# Patient Record
Sex: Male | Born: 1990 | Race: Black or African American | Hispanic: No | Marital: Single | Smoking: Never smoker
Health system: Southern US, Community
[De-identification: ages and names within clinical notes are randomized; demographics above are authoritative.]

## PROBLEM LIST (undated history)

## (undated) DIAGNOSIS — K635 Polyp of colon: Secondary | ICD-10-CM

## (undated) DIAGNOSIS — Z789 Other specified health status: Secondary | ICD-10-CM

## (undated) DIAGNOSIS — T7840XA Allergy, unspecified, initial encounter: Secondary | ICD-10-CM

## (undated) DIAGNOSIS — J45909 Unspecified asthma, uncomplicated: Secondary | ICD-10-CM

## (undated) DIAGNOSIS — I10 Essential (primary) hypertension: Secondary | ICD-10-CM

## (undated) HISTORY — DX: Allergy, unspecified, initial encounter: T78.40XA

## (undated) HISTORY — DX: Polyp of colon: K63.5

## (undated) HISTORY — PX: TONSILLECTOMY: SUR1361

---

## 2012-10-27 DIAGNOSIS — K635 Polyp of colon: Secondary | ICD-10-CM

## 2012-10-27 HISTORY — DX: Polyp of colon: K63.5

## 2012-10-27 HISTORY — PX: COLONOSCOPY: SHX174

## 2012-10-27 HISTORY — PX: UPPER GI ENDOSCOPY: SHX6162

## 2017-01-28 ENCOUNTER — Encounter: Payer: Self-pay | Admitting: Medical

## 2017-01-28 ENCOUNTER — Ambulatory Visit: Payer: Self-pay | Admitting: Medical

## 2017-01-28 VITALS — BP 130/82 | HR 84 | Temp 97.9°F | Resp 16 | Ht 75.0 in | Wt 347.0 lb

## 2017-01-28 DIAGNOSIS — R110 Nausea: Secondary | ICD-10-CM

## 2017-01-28 DIAGNOSIS — K219 Gastro-esophageal reflux disease without esophagitis: Secondary | ICD-10-CM

## 2017-01-28 DIAGNOSIS — R5383 Other fatigue: Secondary | ICD-10-CM

## 2017-01-28 DIAGNOSIS — R1011 Right upper quadrant pain: Secondary | ICD-10-CM

## 2017-01-28 DIAGNOSIS — R58 Hemorrhage, not elsewhere classified: Secondary | ICD-10-CM

## 2017-01-28 DIAGNOSIS — R1013 Epigastric pain: Secondary | ICD-10-CM

## 2017-01-28 MED ORDER — OMEPRAZOLE 20 MG PO CPDR: 20.0000 mg | DELAYED_RELEASE_CAPSULE | Freq: Every day | ORAL | 0 refills | Status: DC

## 2017-01-28 NOTE — Progress Notes (Signed)
Subjective:    Patient ID: Gerald Hanson, male    DOB: 01-30-1991, 26 y.o.   MRN: [id number]  HPI  26 yo male started Sunday morning , with abdominal cramping, fatigue, clammy feeling and nausea vomited one time. Seemed to improve Sunday evening and Monday.  Monday  epigastric pain starting at  3pm, described as epigastric area,  sharp , constant initially and then on and off. Able to take a nap.  Had a burger and french fries (only ate this on Monday) at lunch time.  Woke up with on and off pain, stopped around 9 pm. Stomach felt heavy and tight. Tuesday ate lunch salad with girlled chicken and tomato basal soup , then pain resumed. Sharp , intense, used heating pad and took a nap.  Queasy feeling ate chicken quesadilla and salad, no pain afterwards , just nausea. Today hungry and pressure , no pain.  Did not eat breakfast. Colonoscopy 2014 and had a polyp removed. Endoscope done at that time but patient said he resisted and was difficult to remain sedated and so the test was not that good in diagnostic quality.  He states he has has these symptoms for a while but the last episode started on Sunday. Has also noticed blood in the toilet a couple times cannot recall exact time frame.  Also notes with wiping blood noted everyday x  2 weeks on tissue.Denies hemorrhoids.Denies any blood in stool. Has BM 4 times per day. Ranging from hard to complete liquid since colleage. Family history of  colon cancer in father, h. Pylori, diverticulitis , and removal of  18 inches of colon. in maternal grandmother, mother with  GERD and Aunt with Crohns disease. Patient using peptobismol  about 2 x per week for symptoms.  His symptoms seem to occur with eating so he has been eating less. Only a  3-5 lb weight loss noted since the symptoms began on Sunday.  Did loose about 30 lbs last spring while trying to loose weight. He is in no pain now, however he has not eaten today either.     Review of Systems  Constitutional:  Negative for chills, diaphoresis and fever.  HENT: Positive for sore throat.   Eyes: Negative.   Respiratory: Negative for cough and wheezing.   Cardiovascular: Negative for chest pain, palpitations and leg swelling.  Gastrointestinal: Positive for abdominal pain, constipation, diarrhea, nausea and vomiting.       Objective:   Physical Exam  Constitutional: He is oriented to person, place, and time. He appears well-developed and well-nourished.  HENT:  Head: Normocephalic and atraumatic.  Mouth/Throat: Uvula is midline, oropharynx is clear and moist and mucous membranes are normal.  Eyes: Conjunctivae and EOM are normal. Pupils are equal, round, and reactive to light.  Cardiovascular: Normal rate and regular rhythm.  Exam reveals no gallop and no friction rub.   No murmur heard. Pulmonary/Chest: Effort normal and breath sounds normal.  Abdominal: Soft. Bowel sounds are normal. He exhibits no distension and no mass. There is tenderness. There is no rebound and no guarding.  Musculoskeletal: Normal range of motion.  Neurological: He is alert and oriented to person, place, and time.  Skin: Skin is dry.  Psychiatric: He has a normal mood and affect.  Nursing note and vitals reviewed. patient noted to be obese, he is  tender with palpation of epigastric area and medial side of RUQ,  Mild erythema of Uvula no swelilng, posterior pharynx within normal limits   Patient  declines rectal exam due to time constraints. 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    Assessment & Plan:   Abdominal pain epigastric and RUQ pain,and nausea will order CBC, Met C , lipase, Vit D level ,TSH  Bland diet,reviewed with patient, no acidic foods ie tomatoes or orange juice, to avoid alcohol and caffeine. Will schedule for RUQ U/S. Started patient on omeprazole 20 mg once per day  #30 no refill. Return to the clinic as needed; if pain worsens or nausea and  vomiting or fever occur.

## 2017-01-30 ENCOUNTER — Ambulatory Visit: Payer: Self-pay

## 2017-02-02 ENCOUNTER — Telehealth: Payer: Self-pay | Admitting: Medical

## 2017-02-02 ENCOUNTER — Ambulatory Visit
Admission: RE | Admit: 2017-02-02 | Discharge: 2017-02-02 | Disposition: A | Discharge status: Home / Self Care | Payer: BLUE CROSS/BLUE SHIELD | Source: Ambulatory Visit | Attending: Medical | Admitting: Medical

## 2017-02-02 DIAGNOSIS — R1011 Right upper quadrant pain: Secondary | ICD-10-CM | POA: Diagnosis present

## 2017-02-02 DIAGNOSIS — K8020 Calculus of gallbladder without cholecystitis without obstruction: Secondary | ICD-10-CM | POA: Insufficient documentation

## 2017-02-02 DIAGNOSIS — R110 Nausea: Secondary | ICD-10-CM | POA: Diagnosis present

## 2017-02-02 NOTE — Telephone Encounter (Signed)
Called patient with U/S results. He has pending lab work 02/03/2017 and appointment with GI at 9:30 am. He is feeling much better since following a bland diet.

## 2017-02-03 ENCOUNTER — Other Ambulatory Visit: Payer: Self-pay

## 2017-02-04 ENCOUNTER — Other Ambulatory Visit: Payer: Self-pay

## 2017-02-04 DIAGNOSIS — Z0000 Encounter for general adult medical examination without abnormal findings: Secondary | ICD-10-CM

## 2017-02-05 ENCOUNTER — Encounter: Payer: Self-pay | Admitting: *Deleted

## 2017-02-05 LAB — CMP12+LP+TP+TSH+6AC+CBC/D/PLT
ALK PHOS: 109 IU/L (ref 39–117)
ALT: 52 IU/L — AB (ref 0–44)
AST: 12 IU/L (ref 0–40)
Albumin/Globulin Ratio: 1.6 (ref 1.2–2.2)
Albumin: 4.2 g/dL (ref 3.5–5.5)
BASOS: 1 %
BUN / CREAT RATIO: 13 (ref 9–20)
BUN: 11 mg/dL (ref 6–20)
Basophils Absolute: 0 10*3/uL (ref 0.0–0.2)
Bilirubin Total: 0.5 mg/dL (ref 0.0–1.2)
CALCIUM: 9.1 mg/dL (ref 8.7–10.2)
CHOL/HDL RATIO: 5.4 ratio — AB (ref 0.0–5.0)
CREATININE: 0.82 mg/dL (ref 0.76–1.27)
Chloride: 100 mmol/L (ref 96–106)
Cholesterol, Total: 174 mg/dL (ref 100–199)
EOS (ABSOLUTE): 0.1 10*3/uL (ref 0.0–0.4)
Eos: 1 %
Estimated CHD Risk: 1.1 times avg. — ABNORMAL HIGH (ref 0.0–1.0)
Free Thyroxine Index: 1.7 (ref 1.2–4.9)
GFR, EST AFRICAN AMERICAN: 143 mL/min/{1.73_m2} (ref >59)
GFR, EST NON AFRICAN AMERICAN: 124 mL/min/{1.73_m2} (ref >59)
GGT: 364 IU/L — AB (ref 0–65)
GLUCOSE: 99 mg/dL (ref 65–99)
Globulin, Total: 2.7 g/dL (ref 1.5–4.5)
HDL: 32 mg/dL — AB (ref >39)
HEMATOCRIT: 41.3 % (ref 37.5–51.0)
Hemoglobin: 12.9 g/dL — ABNORMAL LOW (ref 13.0–17.7)
IMMATURE GRANS (ABS): 0 10*3/uL (ref 0.0–0.1)
Immature Granulocytes: 0 %
Iron: 108 ug/dL (ref 38–169)
LDH: 145 IU/L (ref 121–224)
LDL CALC: 118 mg/dL — AB (ref 0–99)
LYMPHS: 35 %
Lymphocytes Absolute: 2.3 10*3/uL (ref 0.7–3.1)
MCH: 22.7 pg — ABNORMAL LOW (ref 26.6–33.0)
MCHC: 31.2 g/dL — ABNORMAL LOW (ref 31.5–35.7)
MCV: 73 fL — AB (ref 79–97)
MONOCYTES: 8 %
Monocytes Absolute: 0.5 10*3/uL (ref 0.1–0.9)
NEUTROS ABS: 3.7 10*3/uL (ref 1.4–7.0)
Neutrophils: 55 %
PHOSPHORUS: 4.3 mg/dL (ref 2.5–4.5)
POTASSIUM: 4.4 mmol/L (ref 3.5–5.2)
Platelets: 340 10*3/uL (ref 150–379)
RBC: 5.68 x10E6/uL (ref 4.14–5.80)
RDW: 15.7 % — AB (ref 12.3–15.4)
Sodium: 139 mmol/L (ref 134–144)
T3 Uptake Ratio: 26 % (ref 24–39)
T4 TOTAL: 6.4 ug/dL (ref 4.5–12.0)
TSH: 1.76 u[IU]/mL (ref 0.450–4.500)
Total Protein: 6.9 g/dL (ref 6.0–8.5)
Triglycerides: 119 mg/dL (ref 0–149)
URIC ACID: 6.6 mg/dL (ref 3.7–8.6)
VLDL Cholesterol Cal: 24 mg/dL (ref 5–40)
WBC: 6.6 10*3/uL (ref 3.4–10.8)

## 2017-02-05 LAB — VITAMIN D 25 HYDROXY (VIT D DEFICIENCY, FRACTURES): Vit D, 25-Hydroxy: 12 ng/mL — ABNORMAL LOW (ref 30.0–100.0)

## 2017-02-05 LAB — LIPASE: LIPASE: 15 U/L (ref 13–78)

## 2017-02-09 ENCOUNTER — Encounter: Payer: Self-pay | Admitting: Medical

## 2017-02-09 ENCOUNTER — Telehealth: Payer: Self-pay | Admitting: Medical

## 2017-02-09 NOTE — Telephone Encounter (Signed)
Called patient and reviewed labs, lipase with in normal limits.  Vitamin D is low.  He says he is pending surgical consult for his gallbladder.  GI put patient on omeprazole for a longer time period ( note reviewed). Medication has  helped patient with GERD symptoms. Reviewed with patient low vitamin D level. Will email patient the amount of vitamin D3 to take.

## 2017-02-12 ENCOUNTER — Ambulatory Visit (INDEPENDENT_AMBULATORY_CARE_PROVIDER_SITE_OTHER): Payer: BLUE CROSS/BLUE SHIELD | Admitting: General Surgery

## 2017-02-12 ENCOUNTER — Encounter: Payer: Self-pay | Admitting: General Surgery

## 2017-02-12 VITALS — BP 128/88 | HR 84 | Resp 12 | Ht 75.0 in | Wt 344.0 lb

## 2017-02-12 DIAGNOSIS — K8020 Calculus of gallbladder without cholecystitis without obstruction: Secondary | ICD-10-CM | POA: Diagnosis not present

## 2017-02-12 NOTE — Progress Notes (Addendum)
Patient ID: Gerald Hanson, male   DOB: April 05, 1991, 26 y.o.   MRN: [id number]  Chief Complaint  Patient presents with  . Other    gall stones    HPI Gerald Hanson is a 26 y.o. male.  Here today for evaluation of his gall bladder. He does admit to indigestion(eggs and onions) and abdominal pain. The pain is upper right abdomen and central abdomen. Last pain episode was 1.5 weeks ago. Bowels move at least 2-3 times a day.  He was shopping after Easter and he had intense abdominal pain and chills, he thought it was food poisoning. After several hours it calmed down but the next day it came back after eating a salad with nausea. He then had and abdominal ultasound 02-02-17.  He has recently stopped his omeprazole so he could be tested for H pylori by GI, Tawni Pummel PA. He works at [employer] with [employer].  HPI  Past Medical History:  Diagnosis Date  . Allergy   . Colon polyp 2014    Past Surgical History:  Procedure Laterality Date  . COLONOSCOPY  2014   Dr Cecelia Byars  . TONSILLECTOMY    . UPPER GI ENDOSCOPY  2014    Family History  Problem Relation Age of Onset  . Colon cancer Father 1  . Diverticulitis Maternal Grandmother   . Crohn's disease Maternal Aunt     Social History Social History  Substance Use Topics  . Smoking status: Never Smoker  . Smokeless tobacco: Never Used  . Alcohol use Yes     Comment: socially    Allergies  Allergen Reactions  . Blackberry Flavor Other (See Comments)    Lip numbness    No current outpatient prescriptions on file.   No current facility-administered medications for this visit.     Review of Systems Review of Systems  Constitutional: Negative.   Respiratory: Negative.   Cardiovascular: Negative.   Gastrointestinal: Positive for abdominal pain.    Blood pressure 128/88, pulse 84, resp. rate 12, height  (1.905 m), weight (!) 344 lb (156 kg).  Physical Exam Physical Exam  Constitutional: He is oriented to  person, place, and time. He appears well-developed and well-nourished.  HENT:  Mouth/Throat: Oropharynx is clear and moist.  Eyes: Conjunctivae are normal. No scleral icterus.  Neck: Neck supple.  Cardiovascular: Normal rate, regular rhythm and normal heart sounds.   Pulmonary/Chest: Effort normal and breath sounds normal.  Abdominal: Soft. Normal appearance and bowel sounds are normal. There is no hepatomegaly. There is no tenderness.  Lymphadenopathy:    He has no cervical adenopathy.  Neurological: He is alert and oriented to person, place, and time.  Skin: Skin is warm and dry.  Psychiatric: His behavior is normal.    Data Reviewed Abdominal ultrasound 02/02/2017 was reviewed. Multiple gallstones. No evidence of acute cholecystitis. Laboratory studies of 02/04/2017 were reviewed:  Lipase normal at 15. Elevated GGT at 364, elevated SGPT at 52. Normal LDH/SGOT, total bilirubin and alkaline phosphatase.  Normal electrolytes and renal function. Hemoglobin 12.9 with an MCV of 73, platelet count 140,000, white blood cell count of 6600 with normal differential.  GI consultation note of 02/12/2017 from Tawni Pummel, [place] reviewed.     Assessment    Symptomatic cholelithiasis.  Microcytosis with minimal anemia.  Elevated serum GGT.    Plan    We'll arrange for the patient have a hemoglobin electrophoresis to look for hemoglobin S, as well as checking iron and ferritin levels.Marland Kitcheno Will have  the patient complete a set of stool hemocult slides as well.   Dietary modification to avoid symptoms prior to cholecystectomy were discussed.    Laparoscopic Cholecystectomy with Intraoperative Cholangiogram. The procedure, including it's potential risks and complications (including but not limited to infection, bleeding, injury to intra-abdominal organs or bile ducts, bile leak, poor cosmetic result, sepsis and death) were discussed with the patient in detail. Non-operative options, including  their inherent risks (acute calculous cholecystitis with possible choledocholithiasis or gallstone pancreatitis, with the risk of ascending cholangitis, sepsis, and death) were discussed as well. The patient expressed and understanding of what we discussed and wishes to proceed with laparoscopic cholecystectomy. The patient further understands that if it is technically not possible, or it is unsafe to proceed laparoscopically, that I will convert to an open cholecystectomy.   HPI, Physical Exam, Assessment and Plan have been scribed under the direction and in the presence of Earline Mayotte, MD.  Dorathy Daft, RN  I have completed the exam and reviewed the above documentation for accuracy and completeness.  I agree with the above.  Museum/gallery conservator has been used and any errors in dictation or transcription are unintentional.  Donnalee Curry, M.D., F.A.C.S.   Earline Mayotte 02/13/2017, 6:59 AM  Patient's surgery has been scheduled for 04-24-17 at [facility]. This is per his request to wait until the school year is out. Patient will also be taking a summer trip 04-10-17 through 04-19-17. History and physical will be updated the morning of procedure.   Nicholes Mango, CMA

## 2017-02-12 NOTE — Patient Instructions (Addendum)
The patient is aware to call back for any questions or concerns.  Laparoscopic Cholecystectomy Laparoscopic cholecystectomy is surgery to remove the gallbladder. The gallbladder is a pear-shaped organ that lies beneath the liver on the right side of the body. The gallbladder stores bile, which is a fluid that helps the body to digest fats. Cholecystectomy is often done for inflammation of the gallbladder (cholecystitis). This condition is usually caused by a buildup of gallstones (cholelithiasis) in the gallbladder. Gallstones can block the flow of bile, which can result in inflammation and pain. In severe cases, emergency surgery may be required. This procedure is done though small incisions in your abdomen (laparoscopic surgery). A thin scope with a camera (laparoscope) is inserted through one incision. Thin surgical instruments are inserted through the other incisions. In some cases, a laparoscopic procedure may be turned into a type of surgery that is done through a larger incision (open surgery). Tell a health care provider about:  Any allergies you have.  All medicines you are taking, including vitamins, herbs, eye drops, creams, and over-the-counter medicines.  Any problems you or family members have had with anesthetic medicines.  Any blood disorders you have.  Any surgeries you have had.  Any medical conditions you have.  Whether you are pregnant or may be pregnant. What are the risks? Generally, this is a safe procedure. However, problems may occur, including:  Infection.  Bleeding.  Allergic reactions to medicines.  Damage to other structures or organs.  A stone remaining in the common bile duct. The common bile duct carries bile from the gallbladder into the small intestine.  A bile leak from the cyst duct that is clipped when your gallbladder is removed. What happens before the procedure? Staying hydrated  Follow instructions from your health care provider about  hydration, which may include:  Up to 2 hours before the procedure - you may continue to drink clear liquids, such as water, clear fruit juice, black coffee, and plain tea. Eating and drinking restrictions  Follow instructions from your health care provider about eating and drinking, which may include:  8 hours before the procedure - stop eating heavy meals or foods such as meat, fried foods, or fatty foods.  6 hours before the procedure - stop eating light meals or foods, such as toast or cereal.  6 hours before the procedure - stop drinking milk or drinks that contain milk.  2 hours before the procedure - stop drinking clear liquids. Medicines   Ask your health care provider about:  Changing or stopping your regular medicines. This is especially important if you are taking diabetes medicines or blood thinners.  Taking medicines such as aspirin and ibuprofen. These medicines can thin your blood. Do not take these medicines before your procedure if your health care provider instructs you not to.  You may be given antibiotic medicine to help prevent infection. General instructions   Let your health care provider know if you develop a cold or an infection before surgery.  Plan to have someone take you home from the hospital or clinic.  Ask your health care provider how your surgical site will be marked or identified. What happens during the procedure?  To reduce your risk of infection:  Your health care team will wash or sanitize their hands.  Your skin will be washed with soap.  Hair may be removed from the surgical area.  An IV tube may be inserted into one of your veins.  You will be given   one or more of the following:  A medicine to help you relax (sedative).  A medicine to make you fall asleep (general anesthetic).  A breathing tube will be placed in your mouth.  Your surgeon will make several small cuts (incisions) in your abdomen.  The laparoscope will be  inserted through one of the small incisions. The camera on the laparoscope will send images to a TV screen (monitor) in the operating room. This lets your surgeon see inside your abdomen.  Air-like gas will be pumped into your abdomen. This will expand your abdomen to give the surgeon more room to perform the surgery.  Other tools that are needed for the procedure will be inserted through the other incisions. The gallbladder will be removed through one of the incisions.  Your common bile duct may be examined. If stones are found in the common bile duct, they may be removed.  After your gallbladder has been removed, the incisions will be closed with stitches (sutures), staples, or skin glue.  Your incisions may be covered with a bandage (dressing). The procedure may vary among health care providers and hospitals. What happens after the procedure?  Your blood pressure, heart rate, breathing rate, and blood oxygen level will be monitored until the medicines you were given have worn off.  You will be given medicines as needed to control your pain.  Do not drive for 24 hours if you were given a sedative. This information is not intended to replace advice given to you by your health care provider. Make sure you discuss any questions you have with your health care provider. Document Released: 10/13/2005 Document Revised: 05/04/2016 Document Reviewed: 03/31/2016 Elsevier Interactive Patient Education  2017 Elsevier Inc.   

## 2017-02-13 ENCOUNTER — Other Ambulatory Visit: Payer: Self-pay

## 2017-02-13 DIAGNOSIS — D649 Anemia, unspecified: Secondary | ICD-10-CM

## 2017-02-13 DIAGNOSIS — K8020 Calculus of gallbladder without cholecystitis without obstruction: Secondary | ICD-10-CM | POA: Insufficient documentation

## 2017-03-12 LAB — HEMOGLOBINOPATHY EVALUATION
HGB C: 0 %
HGB S: 0 %
HGB VARIANT: 0 %
Hemoglobin A2 Quantitation: 2.1 % (ref 1.8–3.2)
Hemoglobin F Quantitation: 0 % (ref 0.0–2.0)
Hgb A: 97.9 % (ref 96.4–98.8)

## 2017-03-12 LAB — IRON AND TIBC
IRON SATURATION: 15 % (ref 15–55)
Iron: 43 ug/dL (ref 38–169)
Total Iron Binding Capacity: 282 ug/dL (ref 250–450)
UIBC: 239 ug/dL (ref 111–343)

## 2017-03-12 LAB — FERRITIN: FERRITIN: 102 ng/mL (ref 30–400)

## 2017-03-16 ENCOUNTER — Ambulatory Visit (INDEPENDENT_AMBULATORY_CARE_PROVIDER_SITE_OTHER): Payer: BLUE CROSS/BLUE SHIELD

## 2017-03-16 DIAGNOSIS — D509 Iron deficiency anemia, unspecified: Secondary | ICD-10-CM | POA: Diagnosis not present

## 2017-03-16 LAB — POC HEMOCCULT BLD/STL (HOME/3-CARD/SCREEN)
Card #2 Fecal Occult Blod, POC: NEGATIVE
FECAL OCCULT BLD: NEGATIVE
FECAL OCCULT BLD: NEGATIVE

## 2017-04-08 ENCOUNTER — Encounter
Admission: RE | Admit: 2017-04-08 | Discharge: 2017-04-08 | Disposition: A | Discharge status: Home / Self Care | Payer: BLUE CROSS/BLUE SHIELD | Source: Ambulatory Visit | Attending: General Surgery | Admitting: General Surgery

## 2017-04-08 HISTORY — DX: Essential (primary) hypertension: I10

## 2017-04-08 HISTORY — DX: Unspecified asthma, uncomplicated: J45.909

## 2017-04-08 HISTORY — DX: Other specified health status: Z78.9

## 2017-04-08 NOTE — Patient Instructions (Signed)
  Your procedure is scheduled on: 04/24/17 Report to Same Day Surgery 2nd floor medical mall [facility]Medical Mall Entrance-take elevator on left to 2nd floor.  Check in with surgery information desk.) To find out your arrival time please call [phone] between 1PM - 3PM on  04/23/17  Remember: Instructions that are not followed completely may result in serious medical risk, up to and including death, or upon the discretion of your surgeon and anesthesiologist your surgery may need to be rescheduled.    _x___ 1. Do not eat food or drink liquids after midnight. No gum chewing or                              hard candies.     __x__ 2. No Alcohol for 24 hours before or after surgery.   __x__3. No Smoking for 24 prior to surgery.   ____  4. Bring all medications with you on the day of surgery if instructed.    __x__ 5. Notify your doctor if there is any change in your medical condition     (cold, fever, infections).     Do not wear jewelry, make-up, hairpins, clips or nail polish.  Do not wear lotions, powders, or perfumes. You may wear deodorant.  Do not shave 48 hours prior to surgery. Men may shave face and neck.  Do not bring valuables to the hospital.    [facility] is not responsible for any belongings or valuables.               Contacts, dentures or bridgework may not be worn into surgery.  Leave your suitcase in the car. After surgery it may be brought to your room.  For patients admitted to the hospital, discharge time is determined by your                       treatment team.   Patients discharged the day of surgery will not be allowed to drive home.  You will need someone to drive you home and stay with you the night of your procedure.    Please read over the following fact sheets that you were given:   [facility] Preparing for Surgery and or MRSA Information   ____ Take anti-hypertensive (unless it includes a diuretic), cardiac, seizure, asthma,     anti-reflux and psychiatric  medicines. These include:  1.   2.  3.  4.  5.  6.  ____Fleets enema or Magnesium Citrate as directed.   ____ Use CHG Soap or sage wipes as directed on instruction sheet   ____ Use inhalers on the day of surgery and bring to hospital day of surgery  ____ Stop Metformin and Janumet 2 days prior to surgery.    ____ Take 1/2 of usual insulin dose the night before surgery and none on the morning     surgery.   ____ Follow recommendations from Cardiologist, Pulmonologist or PCP regarding   stopping Aspirin, Coumadin, Pllavix ,Eliquis, Effient, or Pradaxa, and Pletal.  X____Stop Anti-inflammatories such as Advil, Aleve, Ibuprofen, Motrin, Naproxen, Naprosyn, Goodies powders or aspirin products. OK to take Tylenol and Celebrex.   ____ Stop supplements until after surgery.  But may continue Vitamin D, Vitamin B,  and multivitamin.   ____ Bring C-Pap to the hospital.

## 2017-04-09 ENCOUNTER — Other Ambulatory Visit: Payer: Self-pay | Admitting: General Surgery

## 2017-04-21 ENCOUNTER — Telehealth: Payer: Self-pay

## 2017-04-21 NOTE — Telephone Encounter (Signed)
Patient got back from a cruise on Saturday 04/18/17 and had a bit of a scratchy throat. On Sunday the soar throat was gone but today he has some head congestion. He is scheduled for surgery on 04/24/17 and is concerned about this. He reports no fever, chills, or body aches. Patient is taking Zycam and Vitamin C. Patient instructed to monitor symptoms and call back on Thursday 04/22/17 for an update and if symptoms have not resolved or have worsened we will reschedule his surgery.

## 2017-04-23 ENCOUNTER — Telehealth: Payer: Self-pay

## 2017-04-23 NOTE — Telephone Encounter (Signed)
Patient called with update. States feels like he has allergies bothering him now. He states all head congestion is gone and his scratchy throat did not return. He is just having some clear nasal drainage. He reports that he never had any fevers or chills and feels very good. Surgery instructions reviewed with the patient.

## 2017-04-24 ENCOUNTER — Ambulatory Visit: Payer: BLUE CROSS/BLUE SHIELD | Admitting: Anesthesiology

## 2017-04-24 ENCOUNTER — Ambulatory Visit
Admission: RE | Admit: 2017-04-24 | Discharge: 2017-04-24 | Disposition: A | Discharge status: Home / Self Care | Payer: BLUE CROSS/BLUE SHIELD | Source: Ambulatory Visit | Attending: General Surgery | Admitting: General Surgery

## 2017-04-24 ENCOUNTER — Encounter
Admission: RE | Disposition: A | Discharge status: Home / Self Care | Payer: Self-pay | Source: Ambulatory Visit | Attending: General Surgery

## 2017-04-24 ENCOUNTER — Ambulatory Visit: Payer: BLUE CROSS/BLUE SHIELD

## 2017-04-24 ENCOUNTER — Encounter: Payer: Self-pay | Admitting: *Deleted

## 2017-04-24 DIAGNOSIS — J45909 Unspecified asthma, uncomplicated: Secondary | ICD-10-CM | POA: Insufficient documentation

## 2017-04-24 DIAGNOSIS — Z86010 Personal history of colonic polyps: Secondary | ICD-10-CM | POA: Diagnosis not present

## 2017-04-24 DIAGNOSIS — Z79899 Other long term (current) drug therapy: Secondary | ICD-10-CM | POA: Insufficient documentation

## 2017-04-24 DIAGNOSIS — K8020 Calculus of gallbladder without cholecystitis without obstruction: Secondary | ICD-10-CM

## 2017-04-24 DIAGNOSIS — K8010 Calculus of gallbladder with chronic cholecystitis without obstruction: Secondary | ICD-10-CM | POA: Insufficient documentation

## 2017-04-24 DIAGNOSIS — Z8709 Personal history of other diseases of the respiratory system: Secondary | ICD-10-CM | POA: Insufficient documentation

## 2017-04-24 DIAGNOSIS — Z6841 Body Mass Index (BMI) 40.0 and over, adult: Secondary | ICD-10-CM | POA: Diagnosis not present

## 2017-04-24 DIAGNOSIS — I10 Essential (primary) hypertension: Secondary | ICD-10-CM | POA: Insufficient documentation

## 2017-04-24 DIAGNOSIS — Z91018 Allergy to other foods: Secondary | ICD-10-CM | POA: Diagnosis not present

## 2017-04-24 DIAGNOSIS — K8000 Calculus of gallbladder with acute cholecystitis without obstruction: Secondary | ICD-10-CM

## 2017-04-24 DIAGNOSIS — K8044 Calculus of bile duct with chronic cholecystitis without obstruction: Secondary | ICD-10-CM | POA: Diagnosis not present

## 2017-04-24 DIAGNOSIS — Z9889 Other specified postprocedural states: Secondary | ICD-10-CM | POA: Insufficient documentation

## 2017-04-24 HISTORY — PX: CHOLECYSTECTOMY: SHX55

## 2017-04-24 LAB — URINE DRUG SCREEN, QUALITATIVE (ARMC ONLY)
Amphetamines, Ur Screen: NOT DETECTED
BARBITURATES, UR SCREEN: NOT DETECTED
BENZODIAZEPINE, UR SCRN: NOT DETECTED
CANNABINOID 50 NG, UR ~~LOC~~: NOT DETECTED
Cocaine Metabolite,Ur ~~LOC~~: NOT DETECTED
MDMA (Ecstasy)Ur Screen: NOT DETECTED
Methadone Scn, Ur: NOT DETECTED
Opiate, Ur Screen: NOT DETECTED
PHENCYCLIDINE (PCP) UR S: NOT DETECTED
TRICYCLIC, UR SCREEN: NOT DETECTED

## 2017-04-24 SURGERY — LAPAROSCOPIC CHOLECYSTECTOMY WITH INTRAOPERATIVE CHOLANGIOGRAM
Anesthesia: General | Wound class: Clean Contaminated

## 2017-04-24 MED ORDER — ACETAMINOPHEN 500 MG PO TABS: 500.0000 mg | ORAL_TABLET | Freq: Four times a day (QID) | ORAL | Status: DC | PRN

## 2017-04-24 MED ORDER — ONDANSETRON HCL 4 MG/2ML IJ SOLN
INTRAMUSCULAR | Status: DC | PRN
  Administered 2017-04-24: 4 mg via INTRAVENOUS

## 2017-04-24 MED ORDER — DEXAMETHASONE SODIUM PHOSPHATE 10 MG/ML IJ SOLN
INTRAMUSCULAR | Status: DC | PRN
  Administered 2017-04-24: 5 mg via INTRAVENOUS

## 2017-04-24 MED ORDER — FENTANYL CITRATE (PF) 100 MCG/2ML IJ SOLN
INTRAMUSCULAR | Status: AC
  Filled 2017-04-24: qty 2

## 2017-04-24 MED ORDER — LACTATED RINGERS IV SOLN
INTRAVENOUS | Status: DC
  Administered 2017-04-24 (×3): via INTRAVENOUS

## 2017-04-24 MED ORDER — KETOROLAC TROMETHAMINE 30 MG/ML IJ SOLN
INTRAMUSCULAR | Status: DC | PRN
  Administered 2017-04-24: 30 mg via INTRAVENOUS

## 2017-04-24 MED ORDER — MIDAZOLAM HCL 2 MG/2ML IJ SOLN
INTRAMUSCULAR | Status: AC
  Filled 2017-04-24: qty 2

## 2017-04-24 MED ORDER — MIDAZOLAM HCL 2 MG/2ML IJ SOLN
INTRAMUSCULAR | Status: DC | PRN
  Administered 2017-04-24: 2 mg via INTRAVENOUS

## 2017-04-24 MED ORDER — FENTANYL CITRATE (PF) 100 MCG/2ML IJ SOLN
INTRAMUSCULAR | Status: DC | PRN
  Administered 2017-04-24: 50 ug via INTRAVENOUS
  Administered 2017-04-24: 100 ug via INTRAVENOUS

## 2017-04-24 MED ORDER — LIDOCAINE HCL (CARDIAC) 20 MG/ML IV SOLN
INTRAVENOUS | Status: DC | PRN
  Administered 2017-04-24: 100 mg via INTRAVENOUS

## 2017-04-24 MED ORDER — ACETAMINOPHEN 500 MG PO TABS
ORAL_TABLET | ORAL | Status: AC
  Administered 2017-04-24: 500 mg
  Filled 2017-04-24: qty 2

## 2017-04-24 MED ORDER — SODIUM CHLORIDE 0.9 % IJ SOLN
INTRAMUSCULAR | Status: AC
  Filled 2017-04-24: qty 50

## 2017-04-24 MED ORDER — HYDROMORPHONE HCL 1 MG/ML IJ SOLN
INTRAMUSCULAR | Status: DC | PRN
  Administered 2017-04-24: 0.5 mg via INTRAVENOUS

## 2017-04-24 MED ORDER — SUCCINYLCHOLINE CHLORIDE 20 MG/ML IJ SOLN
INTRAMUSCULAR | Status: DC | PRN
  Administered 2017-04-24: 120 mg via INTRAVENOUS

## 2017-04-24 MED ORDER — HYDROCODONE-ACETAMINOPHEN 5-325 MG PO TABS: 1.0000 | ORAL_TABLET | ORAL | 0 refills | Status: DC | PRN

## 2017-04-24 MED ORDER — HYDROCODONE-ACETAMINOPHEN 5-325 MG PO TABS
ORAL_TABLET | ORAL | Status: AC
  Filled 2017-04-24: qty 1

## 2017-04-24 MED ORDER — HYDROMORPHONE HCL 1 MG/ML IJ SOLN
INTRAMUSCULAR | Status: AC
  Filled 2017-04-24: qty 1

## 2017-04-24 MED ORDER — SEVOFLURANE IN SOLN
RESPIRATORY_TRACT | Status: AC
  Filled 2017-04-24: qty 250

## 2017-04-24 MED ORDER — FENTANYL CITRATE (PF) 250 MCG/5ML IJ SOLN
INTRAMUSCULAR | Status: AC
  Filled 2017-04-24: qty 5

## 2017-04-24 MED ORDER — IOTHALAMATE MEGLUMINE 60 % INJ SOLN
INTRAMUSCULAR | Status: DC | PRN
  Administered 2017-04-24: 17 mL

## 2017-04-24 MED ORDER — PROPOFOL 10 MG/ML IV BOLUS
INTRAVENOUS | Status: AC
  Filled 2017-04-24: qty 20

## 2017-04-24 MED ORDER — ROCURONIUM BROMIDE 100 MG/10ML IV SOLN
INTRAVENOUS | Status: DC | PRN
  Administered 2017-04-24: 30 mg via INTRAVENOUS
  Administered 2017-04-24: 20 mg via INTRAVENOUS

## 2017-04-24 MED ORDER — HYDROCODONE-ACETAMINOPHEN 5-325 MG PO TABS: 1.0000 | ORAL_TABLET | ORAL | Status: DC | PRN

## 2017-04-24 MED ORDER — ONDANSETRON HCL 4 MG/2ML IJ SOLN: 4.0000 mg | Freq: Once | INTRAMUSCULAR | Status: DC | PRN

## 2017-04-24 MED ORDER — SUGAMMADEX SODIUM 200 MG/2ML IV SOLN
INTRAVENOUS | Status: DC | PRN
  Administered 2017-04-24: 400 mg via INTRAVENOUS

## 2017-04-24 MED ORDER — FENTANYL CITRATE (PF) 100 MCG/2ML IJ SOLN
25.0000 ug | INTRAMUSCULAR | Status: AC | PRN
  Administered 2017-04-24 (×6): 25 ug via INTRAVENOUS

## 2017-04-24 MED ORDER — PROPOFOL 10 MG/ML IV BOLUS
INTRAVENOUS | Status: DC | PRN
  Administered 2017-04-24: 200 mg via INTRAVENOUS
  Administered 2017-04-24: 100 mg via INTRAVENOUS

## 2017-04-24 MED ORDER — ACETAMINOPHEN 10 MG/ML IV SOLN
INTRAVENOUS | Status: DC | PRN
  Administered 2017-04-24: 1000 mg via INTRAVENOUS

## 2017-04-24 SURGICAL SUPPLY — 49 items
APPLIER CLIP ROT 10 11.4 M/L (STAPLE) ×3
BLADE SURG 11 STRL SS SAFETY (MISCELLANEOUS) ×3 IMPLANT
CANISTER SUCT 1200ML W/VALVE (MISCELLANEOUS) ×3 IMPLANT
CANNULA DILATOR  5MM W/SLV (CANNULA) ×2
CANNULA DILATOR 10 W/SLV (CANNULA) ×2 IMPLANT
CANNULA DILATOR 10MM W/SLV (CANNULA) ×1
CANNULA DILATOR 5 W/SLV (CANNULA) ×4 IMPLANT
CATH CHOLANG 76X19 KUMAR (CATHETERS) ×3 IMPLANT
CHLORAPREP W/TINT 26ML (MISCELLANEOUS) ×3 IMPLANT
CLIP APPLIE ROT 10 11.4 M/L (STAPLE) ×1 IMPLANT
CLOSURE WOUND 1/2 X4 (GAUZE/BANDAGES/DRESSINGS) ×1
CONRAY 60ML FOR OR (MISCELLANEOUS) ×3 IMPLANT
DISSECTOR KITTNER STICK (MISCELLANEOUS) IMPLANT
DISSECTORS/KITTNER STICK (MISCELLANEOUS)
DRAPE SHEET LG 3/4 BI-LAMINATE (DRAPES) ×3 IMPLANT
DRSG TEGADERM 2-3/8X2-3/4 SM (GAUZE/BANDAGES/DRESSINGS) ×12 IMPLANT
DRSG TELFA 4X3 1S NADH ST (GAUZE/BANDAGES/DRESSINGS) ×3 IMPLANT
ELECT REM PT RETURN 9FT ADLT (ELECTROSURGICAL) ×3
ELECTRODE REM PT RTRN 9FT ADLT (ELECTROSURGICAL) ×1 IMPLANT
ENDOPOUCH RETRIEVER 10 (MISCELLANEOUS) ×3 IMPLANT
GLOVE BIO SURGEON STRL SZ7.5 (GLOVE) ×12 IMPLANT
GLOVE INDICATOR 8.0 STRL GRN (GLOVE) ×9 IMPLANT
GOWN STRL REUS W/ TWL LRG LVL3 (GOWN DISPOSABLE) ×3 IMPLANT
GOWN STRL REUS W/TWL LRG LVL3 (GOWN DISPOSABLE) ×6
HEMOSTAT SURGICEL 2X14 (HEMOSTASIS) ×3 IMPLANT
IRRIGATION STRYKERFLOW (MISCELLANEOUS) ×1 IMPLANT
IRRIGATOR STRYKERFLOW (MISCELLANEOUS) ×3
IV LACTATED RINGERS 1000ML (IV SOLUTION) ×3 IMPLANT
KIT PINK PAD W/HEAD ARE REST (MISCELLANEOUS) ×3
KIT PINK PAD W/HEAD ARM REST (MISCELLANEOUS) ×1 IMPLANT
KIT RM TURNOVER STRD PROC AR (KITS) ×3 IMPLANT
LABEL OR SOLS (LABEL) IMPLANT
NDL INSUFF ACCESS 14 VERSASTEP (NEEDLE) ×3 IMPLANT
NS IRRIG 500ML POUR BTL (IV SOLUTION) ×3 IMPLANT
PACK LAP CHOLECYSTECTOMY (MISCELLANEOUS) ×3 IMPLANT
PENCIL ELECTRO HAND CTR (MISCELLANEOUS) ×3 IMPLANT
SCISSORS METZENBAUM CVD 33 (INSTRUMENTS) ×3 IMPLANT
SEAL FOR SCOPE WARMER C3101 (MISCELLANEOUS) IMPLANT
STRIP CLOSURE SKIN 1/2X4 (GAUZE/BANDAGES/DRESSINGS) ×2 IMPLANT
SUT MAXON ABS #0 GS21 30IN (SUTURE) ×3 IMPLANT
SUT PDS PLUS 0 (SUTURE) ×2
SUT PDS PLUS AB 0 CT-2 (SUTURE) ×1 IMPLANT
SUT VIC AB 0 CT2 27 (SUTURE) ×6 IMPLANT
SUT VIC AB 4-0 FS2 27 (SUTURE) ×3 IMPLANT
SWABSTK COMLB BENZOIN TINCTURE (MISCELLANEOUS) ×3 IMPLANT
TROCAR XCEL BLUNT TIP 100MML (ENDOMECHANICALS) ×3 IMPLANT
TROCAR XCEL NON-BLD 11X100MML (ENDOMECHANICALS) ×3 IMPLANT
TUBING INSUFFLATOR HI FLOW (MISCELLANEOUS) ×3 IMPLANT
WATER STERILE IRR 1000ML POUR (IV SOLUTION) ×3 IMPLANT

## 2017-04-24 NOTE — Anesthesia Procedure Notes (Signed)
Procedure Name: Intubation Date/Time: 04/24/2017 8:38 AM Performed by: [facility] Pre-anesthesia Checklist: Patient identified, Patient being monitored, Timeout performed, Emergency Drugs available and Suction available Patient Re-evaluated:Patient Re-evaluated prior to inductionOxygen Delivery Method: Circle system utilized Preoxygenation: Pre-oxygenation with 100% oxygen Intubation Type: IV induction Ventilation: Mask ventilation without difficulty Laryngoscope Size: Mac and 4 Grade View: Grade I Tube type: Oral Tube size: 7.5 mm Number of attempts: 1 Airway Equipment and Method: Stylet Placement Confirmation: ETT inserted through vocal cords under direct vision,  positive ETCO2 and breath sounds checked- equal and bilateral Secured at: 21 cm Tube secured with: Tape Dental Injury: Teeth and Oropharynx as per pre-operative assessment

## 2017-04-24 NOTE — Anesthesia Postprocedure Evaluation (Signed)
Anesthesia Post Note  Patient: Gerald CampsMichael Hanson  Procedure(s) Performed: Procedure(s) (LRB): LAPAROSCOPIC CHOLECYSTECTOMY WITH INTRAOPERATIVE CHOLANGIOGRAM (N/A)  Patient location during evaluation: PACU Anesthesia Type: General Level of consciousness: awake and alert and oriented Pain management: pain level controlled Vital Signs Assessment: post-procedure vital signs reviewed and stable Respiratory status: spontaneous breathing Cardiovascular status: blood pressure returned to baseline Anesthetic complications: no     Last Vitals:  Vitals:   04/24/17 1028 04/24/17 1101  BP: (!) 131/91 129/86  Pulse: 85 71  Resp: 16 16  Temp: 36.4 C     Last Pain:  Vitals:   04/24/17 1101  TempSrc:   PainSc: 3                  Rollyn Scialdone

## 2017-04-24 NOTE — H&P (Signed)
Gerald Hanson [id number] 1991-05-15     HPI: 26 y/o male w/ symptomatic cholelithiasis. Mild URI symptoms earlier in the week after returning from [place] in the [place]. Symptoms resolved at present.   Prescriptions Prior to Admission  Medication Sig Dispense Refill Last Dose  . acetaminophen (TYLENOL) 325 MG tablet Take 650 mg by mouth every 6 (six) hours as needed.   Past Week at Unknown time  . aspirin-acetaminophen-caffeine (EXCEDRIN MIGRAINE) 250-250-65 MG tablet Take 2 tablets by mouth every 6 (six) hours as needed for headache.   Past Week at Unknown time  . Cholecalciferol (VITAMIN D) 2000 units CAPS Take 4,000 Units by mouth daily.   04/22/2017  . guaiFENesin (MUCINEX) 600 MG 12 hr tablet Take 600 mg by mouth 2 (two) times daily.   04/23/2017 at Unknown time  . ibuprofen (ADVIL,MOTRIN) 200 MG tablet Take 600 mg by mouth every 6 (six) hours as needed for headache or mild pain.   Past Month at Unknown time  . omeprazole (PRILOSEC) 20 MG capsule Take 20 mg by mouth daily.   10 04/22/2017   Allergies  Allergen Reactions  . Blackberry Flavor Other (See Comments)    Lip numbness   Past Medical History:  Diagnosis Date  . Allergy   . Asthma    AS A CHILD  . Colon polyp 2014  . Hypertension    INTERMITTENT/ NO MEDICINES  . Medical history non-contributory    Past Surgical History:  Procedure Laterality Date  . COLONOSCOPY  2014   Dr Cecelia ByarsHashmi  . TONSILLECTOMY    . UPPER GI ENDOSCOPY  2014   Social History   Social History  . Marital status: Single    Spouse name: N/A  . Number of children: N/A  . Years of education: N/A   Occupational History  . Not on file.   Social History Main Topics  . Smoking status: Never Smoker  . Smokeless tobacco: Never Used  . Alcohol use Yes     Comment: socially  . Drug use: Yes    Types: Marijuana     Comment: occasionally  . Sexual activity: Not on file   Other Topics Concern  . Not on file   Social History Narrative  . No  narrative on file   Social History   Social History Narrative  . No narrative on file     ROS: Negative.     PE: HEENT: Negative. Lungs: Clear. Cardio: RR.  Assessment/Plan:  Proceed with planned cholecystectomy.  Earline MayotteByrnett, Naasia Weilbacher W 04/24/2017

## 2017-04-24 NOTE — Anesthesia Preprocedure Evaluation (Signed)
Anesthesia Evaluation  Patient identified by MRN, date of birth, ID band Patient awake    Reviewed: Allergy & Precautions, NPO status , Patient's Chart, lab work & pertinent test results  Airway Mallampati: II  TM Distance: >3 FB     Dental  (+) Caps, Chipped   Pulmonary asthma ,    Pulmonary exam normal        Cardiovascular hypertension, Normal cardiovascular exam     Neuro/Psych negative psych ROS   GI/Hepatic Neg liver ROS, GERD  ,  Endo/Other  Morbid obesity  Renal/GU negative Renal ROS  negative genitourinary   Musculoskeletal negative musculoskeletal ROS (+)   Abdominal Normal abdominal exam  (+)   Peds negative pediatric ROS (+)  Hematology negative hematology ROS (+)   Anesthesia Other Findings   Reproductive/Obstetrics                             Anesthesia Physical Anesthesia Plan  ASA: III  Anesthesia Plan: General   Post-op Pain Management:    Induction: Intravenous, Rapid sequence and Cricoid pressure planned  PONV Risk Score and Plan: 3 and Ondansetron, Dexamethasone, Propofol, Midazolam and Treatment may vary due to age or medical condition  Airway Management Planned: Oral ETT  Additional Equipment:   Intra-op Plan:   Post-operative Plan: Extubation in OR  Informed Consent: I have reviewed the patients History and Physical, chart, labs and discussed the procedure including the risks, benefits and alternatives for the proposed anesthesia with the patient or authorized representative who has indicated his/her understanding and acceptance.   Dental advisory given  Plan Discussed with: CRNA and Surgeon  Anesthesia Plan Comments:         Anesthesia Quick Evaluation

## 2017-04-24 NOTE — Discharge Instructions (Signed)

## 2017-04-24 NOTE — Transfer of Care (Signed)
Immediate Anesthesia Transfer of Care Note  Patient: Gerald Hanson  Procedure(s) Performed: Procedure(s): LAPAROSCOPIC CHOLECYSTECTOMY WITH INTRAOPERATIVE CHOLANGIOGRAM (N/A)  Patient Location: PACU  Anesthesia Type:General  Level of Consciousness: sedated  Airway & Oxygen Therapy: Patient Spontanous Breathing  Post-op Assessment: Report given to RN and Post -op Vital signs reviewed and stable  Post vital signs: Reviewed and stable  Last Vitals:  Vitals:   04/24/17 0620  BP: 121/77  Pulse: 81  Resp: 18  Temp: 36.1 C    Last Pain:  Vitals:   04/24/17 0620  TempSrc: Tympanic         Complications: No apparent anesthesia complications

## 2017-04-24 NOTE — Anesthesia Post-op Follow-up Note (Cosign Needed)
Anesthesia QCDR form completed.        

## 2017-04-24 NOTE — OR Nursing (Signed)
Per Chaya Gerald Joyce, RN - Dr. Lemar LivingsByrnett does not need to see patient before discharging to home.

## 2017-04-24 NOTE — Op Note (Signed)
Preoperative diagnosis: Chronic cholecystitis and cholelithiasis.  Postoperative diagnosis: Same.  Operative procedure: Laparoscopic cholecystectomy with intraoperative cholangiogram  Operating surgeon: Lane HackerJeffery Cynithia Hakimi, M.D.  Anesthesia: Gen. endotracheal.  Estimated blood loss: 30 mL.  Clinical note: This 26 year old male is episodic abdominal pain and ultrasound showed evidence of cholelithiasis. He is admitted for elective cholecystectomy. SCD stockings for DVT prevention.  Operative note: With the patient under adequate general endotracheal anesthesia the abdomen was prepped with ChloraPrep and draped. The patient was straight placed in Trendelenburg position. An infraumbilical incision was made and a laryngeal hook used to elevate the fascia. Multiple attempts to safely pass a varies needle were unsuccessful. While it irrigated easily and passed the hanging drop test insufflation pressures were high. The needle was removed and a supraumbilical incision approximately 3-4 cm in length was made. This was done sharply and Raney dissection completed electrocautery. 5 cm down the fascia was opened and the peritoneum exposed. A 0 PDS pursestring suture was placed and a Hassan cannula inserted. Pneumoperitoneum was established without difficulty. Inspection of the anterior abdominal wall below the umbilicus was unremarkable both at this time and when examined from the epigastric port site at the end of the procedure.  The patient was placed in reverse Trendelenburg position and rolled to the left. An 11 mm XL port was placed in the epigastrium and 2-5 mm Ports were placed laterally. The gallbladder showed mild changes of chronic inflammation and was somewhat floppy. The dome of the gallbladder was intrahepatic. The neck of the gallbladder was cleared and a small vessel on the back side of the cystic artery account for the majority of blood loss. This was eventually controlled with cautery. Fluoroscopic  cholangiograms were completed using 17 mL of one half strength Conray 60. This showed prompt filling the right left hepatic ducts free flow of the duodenum and no evidence of obstruction or retained stone. The cystic duct and cystic artery were doubly clipped and divided. A small posterior branch was doubly clipped as well. A area of bleeding on the mid portion of the gallbladder bed was controlled with clips, cautery and application of Surgicel with good result. The gallbladder is removed from the liver bed making use of hook cautery dissection. A small rent in the gallbladder resulted in a tiny bile spell but no loss stones. Once the gallbladder was extracted was placed in an Endo Catch bag and delivered to the [place] port site. Pneumoperitoneum was reestablished. The right upper quadrant was irrigated with lactated Ringer solutions and good hemostasis noted. The abdomen was then desufflated under direct vision and ports removed.  The midline fascia at the [facility] cannula site was closed with a 0 PDS figure-of-eight suture 2. Adipose layer was approximated with a similar 0 Vicryl figure-of-eight suture. Skin incisions were closed with 4-0 Vicryl subcuticular sutures. Benzoin, Steri-Strips, Telfa and Tegaderm dressings were applied.  The patient awakened in a somewhat agitated state and was safely restrained by staff. Once the endotracheal tube was removed he became much more, and had no further issues.  The patient was taken recovery room in stable condition.

## 2017-04-27 LAB — SURGICAL PATHOLOGY

## 2017-05-04 ENCOUNTER — Ambulatory Visit (INDEPENDENT_AMBULATORY_CARE_PROVIDER_SITE_OTHER): Payer: BLUE CROSS/BLUE SHIELD | Admitting: General Surgery

## 2017-05-04 ENCOUNTER — Encounter: Payer: Self-pay | Admitting: General Surgery

## 2017-05-04 VITALS — BP 124/80 | HR 88 | Resp 12 | Ht 75.0 in | Wt 353.0 lb

## 2017-05-04 DIAGNOSIS — K8020 Calculus of gallbladder without cholecystitis without obstruction: Secondary | ICD-10-CM

## 2017-05-04 DIAGNOSIS — D509 Iron deficiency anemia, unspecified: Secondary | ICD-10-CM

## 2017-05-04 NOTE — Progress Notes (Signed)
Patient ID: Gerald Hanson, male   DOB: 11-Feb-1991, 26 y.o.   MRN: [id number]  Chief Complaint  Patient presents with  . Routine Post Op    HPI Gerald CampsMichael Choyce is a 26 y.o. male here today for his post op gallbladder surgery done on 04/24/2017. Patient states he is doing well.  HPI  Past Medical History:  Diagnosis Date  . Allergy   . Asthma    AS A CHILD  . Colon polyp 2014  . Hypertension    INTERMITTENT/ NO MEDICINES  . Medical history non-contributory     Past Surgical History:  Procedure Laterality Date  . CHOLECYSTECTOMY N/A 04/24/2017   Procedure: LAPAROSCOPIC CHOLECYSTECTOMY WITH INTRAOPERATIVE CHOLANGIOGRAM;  Surgeon: Earline MayotteByrnett, Jontae Sonier W, MD;  Location: [facility] ORS;  Service: General;  Laterality: N/A;  . COLONOSCOPY  2014   Dr Cecelia ByarsHashmi  . TONSILLECTOMY    . UPPER GI ENDOSCOPY  2014    Family History  Problem Relation Age of Onset  . Colon cancer Father [id number]  . Diverticulitis Maternal Grandmother   . Crohn's disease Maternal Aunt     Social History Social History  Substance Use Topics  . Smoking status: Never Smoker  . Smokeless tobacco: Never Used  . Alcohol use Yes     Comment: socially    Allergies  Allergen Reactions  . Blackberry Flavor Other (See Comments)    Lip numbness    Current Outpatient Prescriptions  Medication Sig Dispense Refill  . acetaminophen (TYLENOL) 325 MG tablet Take 650 mg by mouth every 6 (six) hours as needed.    Marland Kitchen. aspirin-acetaminophen-caffeine (EXCEDRIN MIGRAINE) 250-250-65 MG tablet Take 2 tablets by mouth every 6 (six) hours as needed for headache.    . Cholecalciferol (VITAMIN D) 2000 units CAPS Take 4,000 Units by mouth daily.    Marland Kitchen. guaiFENesin (MUCINEX) 600 MG 12 hr tablet Take 600 mg by mouth 2 (two) times daily.    Marland Kitchen. ibuprofen (ADVIL,MOTRIN) 200 MG tablet Take 600 mg by mouth every 6 (six) hours as needed for headache or mild pain.    Marland Kitchen. omeprazole (PRILOSEC) 20 MG capsule Take 20 mg by mouth daily.   10   No current  facility-administered medications for this visit.     Review of Systems Review of Systems  Constitutional: Negative.   Respiratory: Negative.   Cardiovascular: Negative.     Blood pressure 124/80, pulse 88, resp. rate 12, height 6\' 3"  (1.905 m), weight (!) 353 lb (160.1 kg).  Physical Exam Physical Exam  Constitutional: He is oriented to person, place, and time. He appears well-developed and well-nourished.  Cardiovascular: Normal rate, regular rhythm and normal heart sounds.   Pulmonary/Chest: Effort normal and breath sounds normal.  Abdominal: Soft. Bowel sounds are normal. There is no tenderness.    Neurological: He is alert and oriented to person, place, and time.  Skin: Skin is warm and dry.    Data Reviewed DIAGNOSIS:  A. GALLBLADDER; CHOLECYSTECTOMY:  - CHRONIC CHOLECYSTITIS AND CHOLELITHIASIS.  - NEGATIVE FOR DYSPLASIA AND MALIGNANCY.    Assessment    Doing well status post cholecystectomy.    Plan    Patient was encouraged but avoid weight lifting for the next couple of weeks. He may make use of Cardioquin minute anytime.    Patient to return as needed. The patient is aware to call back for any questions or concerns.  Colonoscopy scheduled September 2018 with Barnetta ChapelMartin Skulskie, M.D. for follow-up previously identified polyps.  HPI, Physical Exam, Assessment and Plan  have been scribed under the direction and in the presence of Donnalee Curry, MD.  Ples Specter, CMA  I have completed the exam and reviewed the above documentation for accuracy and completeness.  I agree with the above.  Museum/gallery conservator has been used and any errors in dictation or transcription are unintentional.  Donnalee Curry, M.D., F.A.C.S.  Earline Mayotte 05/04/2017, 1:16 PM

## 2017-05-04 NOTE — Patient Instructions (Signed)
Patient to return as needed. 

## 2017-07-09 ENCOUNTER — Encounter: Payer: Self-pay | Admitting: *Deleted

## 2017-07-13 ENCOUNTER — Ambulatory Visit
Admission: RE | Admit: 2017-07-13 | Payer: BLUE CROSS/BLUE SHIELD | Source: Ambulatory Visit | Admitting: Gastroenterology

## 2017-07-13 ENCOUNTER — Encounter: Admission: RE | Payer: Self-pay | Source: Ambulatory Visit

## 2017-07-13 SURGERY — COLONOSCOPY WITH PROPOFOL
Anesthesia: General

## 2017-08-05 ENCOUNTER — Ambulatory Visit: Payer: BLUE CROSS/BLUE SHIELD

## 2017-08-05 VITALS — Temp 97.6°F

## 2017-08-05 DIAGNOSIS — Z23 Encounter for immunization: Secondary | ICD-10-CM

## 2017-08-19 ENCOUNTER — Ambulatory Visit: Payer: BLUE CROSS/BLUE SHIELD | Admitting: Medical

## 2017-09-08 ENCOUNTER — Encounter: Payer: Self-pay | Admitting: *Deleted

## 2017-09-09 ENCOUNTER — Ambulatory Visit
Admission: RE | Admit: 2017-09-09 | Discharge: 2017-09-09 | Disposition: A | Discharge status: Home Health | Payer: BLUE CROSS/BLUE SHIELD | Source: Ambulatory Visit | Attending: Internal Medicine | Admitting: Internal Medicine

## 2017-09-09 ENCOUNTER — Ambulatory Visit: Payer: BLUE CROSS/BLUE SHIELD | Admitting: Anesthesiology

## 2017-09-09 ENCOUNTER — Encounter
Admission: RE | Disposition: A | Discharge status: Home Health | Payer: Self-pay | Source: Ambulatory Visit | Attending: Internal Medicine

## 2017-09-09 ENCOUNTER — Encounter: Payer: Self-pay | Admitting: Anesthesiology

## 2017-09-09 DIAGNOSIS — K529 Noninfective gastroenteritis and colitis, unspecified: Secondary | ICD-10-CM | POA: Insufficient documentation

## 2017-09-09 DIAGNOSIS — K219 Gastro-esophageal reflux disease without esophagitis: Secondary | ICD-10-CM | POA: Diagnosis not present

## 2017-09-09 DIAGNOSIS — I10 Essential (primary) hypertension: Secondary | ICD-10-CM | POA: Insufficient documentation

## 2017-09-09 DIAGNOSIS — Z79899 Other long term (current) drug therapy: Secondary | ICD-10-CM | POA: Insufficient documentation

## 2017-09-09 DIAGNOSIS — Z8709 Personal history of other diseases of the respiratory system: Secondary | ICD-10-CM | POA: Insufficient documentation

## 2017-09-09 DIAGNOSIS — K640 First degree hemorrhoids: Secondary | ICD-10-CM | POA: Diagnosis not present

## 2017-09-09 DIAGNOSIS — Z7982 Long term (current) use of aspirin: Secondary | ICD-10-CM | POA: Diagnosis not present

## 2017-09-09 DIAGNOSIS — Z6841 Body Mass Index (BMI) 40.0 and over, adult: Secondary | ICD-10-CM | POA: Insufficient documentation

## 2017-09-09 DIAGNOSIS — Z86010 Personal history of colonic polyps: Secondary | ICD-10-CM | POA: Diagnosis not present

## 2017-09-09 DIAGNOSIS — Z91018 Allergy to other foods: Secondary | ICD-10-CM | POA: Diagnosis not present

## 2017-09-09 HISTORY — PX: COLONOSCOPY WITH PROPOFOL: SHX5780

## 2017-09-09 SURGERY — COLONOSCOPY WITH PROPOFOL
Anesthesia: General

## 2017-09-09 MED ORDER — LIDOCAINE HCL (PF) 2 % IJ SOLN
INTRAMUSCULAR | Status: AC
  Filled 2017-09-09: qty 10

## 2017-09-09 MED ORDER — PROPOFOL 500 MG/50ML IV EMUL
INTRAVENOUS | Status: DC | PRN
  Administered 2017-09-09: 150 ug/kg/min via INTRAVENOUS

## 2017-09-09 MED ORDER — LIDOCAINE HCL (CARDIAC) 20 MG/ML IV SOLN
INTRAVENOUS | Status: DC | PRN
  Administered 2017-09-09: 100 mg via INTRAVENOUS

## 2017-09-09 MED ORDER — PROPOFOL 500 MG/50ML IV EMUL
INTRAVENOUS | Status: AC
  Filled 2017-09-09: qty 50

## 2017-09-09 MED ORDER — SODIUM CHLORIDE 0.9 % IV SOLN
INTRAVENOUS | Status: DC
  Administered 2017-09-09: 1000 mL via INTRAVENOUS

## 2017-09-09 MED ORDER — PROPOFOL 10 MG/ML IV BOLUS
INTRAVENOUS | Status: DC | PRN
  Administered 2017-09-09: 150 mg via INTRAVENOUS
  Administered 2017-09-09: 70 mg via INTRAVENOUS

## 2017-09-09 NOTE — Anesthesia Preprocedure Evaluation (Signed)
Anesthesia Evaluation  Patient identified by MRN, date of birth, ID band Patient awake    Reviewed: Allergy & Precautions, NPO status , Patient's Chart, lab work & pertinent test results  History of Anesthesia Complications Negative for: history of anesthetic complications  Airway Mallampati: II  TM Distance: >3 FB     Dental  (+) Caps, Chipped   Pulmonary neg shortness of breath, asthma (as a child) , neg sleep apnea, neg COPD, neg recent URI,    Pulmonary exam normal        Cardiovascular Exercise Tolerance: Good negative cardio ROS Normal cardiovascular exam     Neuro/Psych negative psych ROS   GI/Hepatic Neg liver ROS, GERD  ,  Endo/Other  neg diabetesMorbid obesity  Renal/GU negative Renal ROS  negative genitourinary   Musculoskeletal negative musculoskeletal ROS (+)   Abdominal Normal abdominal exam  (+)   Peds negative pediatric ROS (+)  Hematology negative hematology ROS (+)   Anesthesia Other Findings Past Medical History: No date: Allergy No date: Asthma     Comment:  AS A CHILD 2014: Colon polyp No date: Hypertension     Comment:  INTERMITTENT/ NO MEDICINES No date: Medical history non-contributory   Reproductive/Obstetrics negative OB ROS                             Anesthesia Physical  Anesthesia Plan  ASA: III  Anesthesia Plan: General   Post-op Pain Management:    Induction: Intravenous  PONV Risk Score and Plan: 3 and Propofol, Treatment may vary due to age or medical condition and Propofol infusion  Airway Management Planned: Nasal Cannula  Additional Equipment:   Intra-op Plan:   Post-operative Plan:   Informed Consent: I have reviewed the patients History and Physical, chart, labs and discussed the procedure including the risks, benefits and alternatives for the proposed anesthesia with the patient or authorized representative who has indicated  his/her understanding and acceptance.   Dental advisory given  Plan Discussed with: CRNA and Surgeon  Anesthesia Plan Comments:         Anesthesia Quick Evaluation

## 2017-09-09 NOTE — Anesthesia Post-op Follow-up Note (Signed)
Anesthesia QCDR form completed.        

## 2017-09-09 NOTE — Anesthesia Postprocedure Evaluation (Signed)
Anesthesia Post Note  Patient: Gerald Hanson  Procedure(s) Performed: COLONOSCOPY WITH PROPOFOL (N/A )  Patient location during evaluation: Endoscopy Anesthesia Type: General Level of consciousness: awake and alert Pain management: pain level controlled Vital Signs Assessment: post-procedure vital signs reviewed and stable Respiratory status: spontaneous breathing, nonlabored ventilation, respiratory function stable and patient connected to nasal cannula oxygen Cardiovascular status: blood pressure returned to baseline and stable Postop Assessment: no apparent nausea or vomiting Anesthetic complications: no     Last Vitals:  Vitals:   09/09/17 1111 09/09/17 1121  BP: (!) 110/98 122/89  Pulse: 68 70  Resp: 16 16  Temp:    SpO2: 99% 100%    Last Pain:  Vitals:   09/09/17 1051  TempSrc: Tympanic                 Lenard SimmerAndrew Jaymes Revels

## 2017-09-09 NOTE — H&P (Signed)
Outpatient short stay form Pre-procedure 09/09/2017 10:11 AM Gerald Hanson Gerald Hanson, M.D.  Primary Physician: Ellie LunchHeather Hanson, M.D.  Reason for visit: Hx of colon polyps, chronic intermittent diarrhea.  History of present illness:  Patient with intermittent loose stools without significant rectal bleeding.Has reported hx of colon polyps.    Current Facility-Administered Medications:  .  0.9 %  sodium chloride infusion, , Intravenous, Continuous, [place], Boykin Nearingeodoro K, MD, Last Rate: 20 mL/hr at 09/09/17 0941, 1,000 mL at 09/09/17 0941  Medications Prior to Admission  Medication Sig Dispense Refill Last Dose  . acetaminophen (TYLENOL) 325 MG tablet Take 650 mg by mouth every 6 (six) hours as needed.   Past Week at Unknown time  . aspirin-acetaminophen-caffeine (EXCEDRIN MIGRAINE) 250-250-65 MG tablet Take 2 tablets by mouth every 6 (six) hours as needed for headache.   Past Week at Unknown time  . Cholecalciferol (VITAMIN D) 2000 units CAPS Take 4,000 Units by mouth daily.   Past Week at Unknown time  . guaiFENesin (MUCINEX) 600 MG 12 hr tablet Take 600 mg by mouth 2 (two) times daily.   Past Week at Unknown time  . ibuprofen (ADVIL,MOTRIN) 200 MG tablet Take 600 mg by mouth every 6 (six) hours as needed for headache or mild pain.   Past Week at Unknown time  . omeprazole (PRILOSEC) 20 MG capsule Take 20 mg by mouth daily.   10 Past Week at Unknown time     Allergies  Allergen Reactions  . Blackberry Flavor Other (See Comments)    Lip numbness     Past Medical History:  Diagnosis Date  . Allergy   . Asthma    AS A CHILD  . Colon polyp 2014  . Hypertension    INTERMITTENT/ NO MEDICINES  . Medical history non-contributory     Review of systems:      Physical Exam  General appearance: alert, cooperative and appears stated age Resp: clear to auscultation bilaterally Cardio: regular rate and rhythm, S1, S2 normal, no murmur, click, rub or gallop GI: soft, non-tender; bowel  sounds normal; no masses,  no organomegaly Extremities: extremities normal, atraumatic, no cyanosis or edema     Planned procedures: Colonoscopy. The patient understands the nature of the planned procedure, indications, risks, alternatives and potential complications including but not limited to bleeding, infection, perforation, damage to internal organs and possible oversedation/side effects from anesthesia. The patient agrees and gives consent to proceed.  Please refer to procedure notes for findings, recommendations and patient disposition/instructions.    Slayden Mennenga Gerald Hanson, M.D. Gastroenterology 09/09/2017  10:11 AM

## 2017-09-09 NOTE — Interval H&P Note (Signed)
History and Physical Interval Note:  09/09/2017 10:13 AM  Gerald CampsMichael Mayor  has presented today for surgery, with the diagnosis of NAUSEA LOOSE STOOLS EPIGASTRIC PAIN  The various methods of treatment have been discussed with the patient and family. After consideration of risks, benefits and other options for treatment, the patient has consented to  Procedure(s): COLONOSCOPY WITH PROPOFOL (N/A) as a surgical intervention .  The patient's history has been reviewed, patient examined, no change in status, stable for surgery.  I have reviewed the patient's chart and labs.  Questions were answered to the patient's satisfaction.     Downeyoledo, Sonoraeodoro

## 2017-09-09 NOTE — Transfer of Care (Signed)
Immediate Anesthesia Transfer of Care Note  Patient: Aura CampsMichael Sanseverino  Procedure(s) Performed: COLONOSCOPY WITH PROPOFOL (N/A )  Patient Location: Endoscopy Unit  Anesthesia Type:General  Level of Consciousness: awake and alert   Airway & Oxygen Therapy: Patient Spontanous Breathing and Patient connected to nasal cannula oxygen  Post-op Assessment: Report given to RN and Post -op Vital signs reviewed and stable  Post vital signs: Reviewed and stable  Last Vitals:  Vitals:   09/09/17 0930 09/09/17 1051  BP: (!) 150/93   Pulse: 83   Resp: 16   Temp: 36.6 C (!) (P) 36.3 C  SpO2: 100%     Last Pain:  Vitals:   09/09/17 1051  TempSrc: (P) Tympanic         Complications: No apparent anesthesia complications

## 2017-09-09 NOTE — Op Note (Signed)
[facility] Gastroenterology Patient Name: Gerald Hanson Procedure Date: 09/09/2017 9:36 AM MRN: [id number] Account #: [id number] Date of Birth: Mar 04, 1991 Admit Type: Outpatient Age: [age] [facility]OM 4 Gender: Male Note Status: Finalized Procedure:            Colonoscopy Indications:          Chronic diarrhea Providers:            [name] Norma Fredricksonoledo MD, MD Referring MD:         Wynn BankerHeather R. Ratcliffe, MD (Referring MD) Medicines:            Propofol per Anesthesia Complications:        No immediate complications. Procedure:            Pre-Anesthesia Assessment:                       - ASA Grade Assessment: II - A patient with mild                        systemic disease.                       - After reviewing the risks and benefits, the patient                        was deemed in satisfactory condition to undergo the                        procedure.                       After obtaining informed consent, the colonoscope was                        passed under direct vision. Throughout the procedure,                        the patient's blood pressure, pulse, and oxygen                        saturations were monitored continuously. The                        Colonoscope was introduced through the anus and                        advanced to the the terminal ileum, with identification                        of the appendiceal orifice and IC valve. The                        colonoscopy was performed without difficulty. The                        patient tolerated the procedure well. The quality of                        the bowel preparation was excellent. The colonoscopy  was performed without difficulty. The patient tolerated                        the procedure well. Findings:      The perianal and digital rectal examinations were normal.      The ileum appeared normal. Biopsies were taken with a cold forceps for   histology.      The colon (entire examined portion) appeared normal. Biopsies for       histology were taken with a cold forceps from the entire colon for       evaluation of microscopic colitis.      Non-bleeding internal hemorrhoids were found during retroflexion. The       hemorrhoids were Grade I (internal hemorrhoids that do not prolapse). Impression:           - The terminal ileum is normal. Biopsied.                       - The entire examined colon is normal. Biopsied. Recommendation:       - Patient has a contact number available for                        emergencies. The signs and symptoms of potential                        delayed complications were discussed with the patient.                        Return to normal activities tomorrow. Written discharge                        instructions were provided to the patient.                       - Resume previous diet.                       - Continue present medications.                       - Await pathology results.                       - Return to GI office in 6 months. Procedure Code(s):    --- Professional ---                       [phone], Colonoscopy, flexible; with biopsy, single or                        multiple Diagnosis Code(s):    --- Professional ---                       K52.9, Noninfective gastroenteritis and colitis,                        unspecified CPT copyright 2016 [employer]. All rights reserved. The codes documented in this report are preliminary and upon coder review may  be revised to meet current compliance requirements. Stanton Kidney MD, MD 09/09/2017 10:50:40 AM This report has been signed electronically. Number of Addenda: 0 Note Initiated On: 09/09/2017  9:36 AM Scope Withdrawal Time: 0 hours 11 minutes 12 seconds  Total Procedure Duration: 0 hours 15 minutes 41 seconds       [facility]

## 2017-09-10 ENCOUNTER — Encounter: Payer: Self-pay | Admitting: Internal Medicine

## 2017-09-10 LAB — SURGICAL PATHOLOGY

## 2017-11-26 ENCOUNTER — Encounter: Payer: Self-pay | Admitting: Adult Health

## 2017-11-26 ENCOUNTER — Ambulatory Visit: Payer: BLUE CROSS/BLUE SHIELD | Admitting: Adult Health

## 2017-11-26 VITALS — BP 134/86 | HR 86 | Temp 97.8°F | Resp 16 | Ht 75.0 in | Wt 368.0 lb

## 2017-11-26 DIAGNOSIS — S93402A Sprain of unspecified ligament of left ankle, initial encounter: Secondary | ICD-10-CM

## 2017-11-26 NOTE — Progress Notes (Signed)
Subjective:     Patient ID: Gerald Hanson, male   DOB: 07-25-1991, 27 y.o.   MRN: [id number]  HPI Patient is a 27 year old male in no acute distress who presents with left ankle pain x 2 weeks. He felt he sprained his ankle. He has been doing RICE at home and in office. He has taken NSAID'S for 2 days after he noticed pain and again this week.   He denies any injury, he does report he wore some non supportive shoes a few days before left anlke pain.  He denies any crepitus.  Left ankle edema x 2 weeks per patient.  He denies any known injury or previous surgeries/injuries.   He reports lateral ankle pain and tender with movement.   He did report increased walking.  Patient Active Problem List   Diagnosis Date Noted  . Gall stones 02/13/2017     Current Outpatient Medications:  .  acetaminophen (TYLENOL) 325 MG tablet, Take 650 mg by mouth every 6 (six) hours as needed., Disp: , Rfl:  .  aspirin-acetaminophen-caffeine (EXCEDRIN MIGRAINE) 250-250-65 MG tablet, Take 2 tablets by mouth every 6 (six) hours as needed for headache., Disp: , Rfl:  .  Cholecalciferol (VITAMIN D) 2000 units CAPS, Take 4,000 Units by mouth daily., Disp: , Rfl:  .  guaiFENesin (MUCINEX) 600 MG 12 hr tablet, Take 600 mg by mouth 2 (two) times daily., Disp: , Rfl:  .  ibuprofen (ADVIL,MOTRIN) 200 MG tablet, Take 600 mg by mouth every 6 (six) hours as needed for headache or mild pain., Disp: , Rfl:  .  omeprazole (PRILOSEC) 20 MG capsule, Take 20 mg by mouth daily. , Disp: , Rfl: 10  Review of Systems  Constitutional: Negative.   HENT: Negative.   Respiratory: Negative.   Cardiovascular: Negative.   Gastrointestinal: Negative.   Genitourinary: Negative.   Musculoskeletal: Positive for gait problem (hurts to put full pressure on left foot) and joint swelling (left ankle ). Negative for arthralgias, back pain, myalgias, neck pain and neck stiffness.  Skin: Negative.   Neurological: Negative for dizziness,  tremors, seizures, syncope, facial asymmetry, speech difficulty, weakness, light-headedness, numbness and headaches.  Hematological: Negative.   Psychiatric/Behavioral: Negative.        Objective:   Physical Exam  Constitutional: He is oriented to person, place, and time. He appears well-developed and well-nourished. No distress.  Obese  Body mass index is 46 kg/m.   Cardiovascular: Normal rate, regular rhythm, normal heart sounds and intact distal pulses. Exam reveals no gallop and no friction rub.  No murmur heard. Pulmonary/Chest: Effort normal and breath sounds normal. No respiratory distress. He has no wheezes. He has no rales. He exhibits no tenderness.  Musculoskeletal:       Right knee: Normal.       Left knee: Normal.       Right ankle: Normal.       Left ankle: He exhibits decreased range of motion (lateral rotation limited ) and swelling (left ankle ). He exhibits no ecchymosis, no deformity, no laceration and normal pulse. Tenderness. Lateral malleolus tenderness found. No medial malleolus, no AITFL, no CF ligament, no posterior TFL, no head of 5th metatarsal and no proximal fibula tenderness found. Achilles tendon exhibits no pain, no defect and normal Thompson's test results.       Right lower leg: Normal.       Left lower leg: Normal.       Right foot: Normal.  Left foot: There is decreased range of motion (left lateral rotation ) and tenderness. There is no bony tenderness, no swelling, normal capillary refill, no crepitus, no deformity and no laceration.       Feet:  Areas tender with palpation marked on diagram. No skin changes.  2 + dorsalis pedal and posterior tibial pulse.    Neurological: He is alert and oriented to person, place, and time. He has normal reflexes.  Skin: He is not diaphoretic.  Vitals reviewed.      Assessment:     Sprain of left ankle, unspecified ligament, initial encounter - Plan: DG Foot Complete Left, DG Ankle Complete Left       Plan:     Orders Placed This Encounter  Procedures  . DG Foot Complete Left    Standing Status:   Future    Standing Expiration Date:   12/24/2017    Order Specific Question:   Reason for Exam (SYMPTOM  OR DIAGNOSIS REQUIRED)    Answer:   left ankle pain x 2 weeks mildly tender with palpation left lateral upper foot    Order Specific Question:   Preferred imaging location?    Answer:   [facility]    Order Specific Question:   Radiology Contrast Protocol - do NOT remove file path    Answer:   \\[email or web address]  . DG Ankle Complete Left    Standing Status:   Future    Standing Expiration Date:   01/25/2019    Order Specific Question:   Reason for Exam (SYMPTOM  OR DIAGNOSIS REQUIRED)    Answer:   left ankle pain/ swelling x 2 weeks.    Order Specific Question:   Preferred imaging location?    Answer:   [facility]    Order Specific Question:   Radiology Contrast Protocol - do NOT remove file path    Answer:   \\[email or web address]  Apply a compressive ACE bandage. Rest and elevate the affected painful area.  Apply cold compresses intermittently as needed.  As pain recedes, begin normal activities slowly as tolerated.  Call if symptoms persist.  Will go for x-ray foot/ ankle.  Denies any kidney issues will take NSAIDS per package instructions for 1 week PRN.   Will call with results.  Continue RICE. If worsens or does not improve will consider orthopedics.  Return to clinic at any time if worsens and be seen in emergency room or urgent care if symptoms worsen or change after hours.  Patient verbalized understanding of all instructions given and denies any further questions at this time.

## 2017-11-26 NOTE — Patient Instructions (Signed)
RICE for Routine Care of Injuries Many injuries can be cared for using rest, ice, compression, and elevation (RICE therapy). Using RICE therapy can help to lessen pain and swelling. It can help your body to heal. Rest Reduce your normal activities and avoid using the injured part of your body. You can go back to your normal activities when you feel okay and your doctor says it is okay. Ice Do not put ice on your bare skin.  Put ice in a plastic bag.  Place a towel between your skin and the bag.  Leave the ice on for 20 minutes, 2-3 times a day.  Do this for as long as told by your doctor. Compression Compression means putting pressure on the injured area. This can be done with an elastic bandage. If an elastic bandage has been applied:  Remove and reapply the bandage every 3-4 hours or as told by your doctor.  Make sure the bandage is not wrapped too tight. Wrap the bandage more loosely if part of your body beyond the bandage is blue, swollen, cold, painful, or loses feeling (numb).  See your doctor if the bandage seems to make your problems worse.  Elevation Elevation means keeping the injured area raised. Raise the injured area above your heart or the center of your chest if you can. When should I get help? You should get help if:  You keep having pain and swelling.  Your symptoms get worse.  Get help right away if: You should get help right away if:  You have sudden bad pain at or below the area of your injury.  You have redness or more swelling around your injury.  You have tingling or numbness at or below the injury that does not go away when you take off the bandage.  This information is not intended to replace advice given to you by your health care provider. Make sure you discuss any questions you have with your health care provider. Document Released: 03/31/2008 Document Revised: 09/09/2016 Document Reviewed: 09/20/2014 Elsevier Interactive Patient Education  2017  [employer] Ankle Sprain An ankle sprain is a stretch or tear in one of the tough tissues (ligaments) in your ankle. Follow these instructions at home:  Rest your ankle.  Take over-the-counter and prescription medicines only as told by your doctor.  For 2-3 days, keep your ankle higher than the level of your heart (elevated) as much as possible.  If directed, put ice on the area: ? Put ice in a plastic bag. ? Place a towel between your skin and the bag. ? Leave the ice on for 20 minutes, 2-3 times a day.  If you were given a brace: ? Wear it as told. ? Take it off to shower or bathe. ? Try not to move your ankle much, but wiggle your toes from time to time. This helps to prevent swelling.  If you were given an elastic bandage (dressing): ? Take it off when you shower or bathe. ? Try not to move your ankle much, but wiggle your toes from time to time. This helps to prevent swelling. ? Adjust the bandage to make it more comfortable if it feels too tight. ? Loosen the bandage if you lose feeling in your foot, your foot tingles, or your foot gets cold and blue.  If you have crutches, use them as told by your doctor. Continue to use them until you can walk without feeling pain in your ankle. Contact a doctor if:  Your bruises or swelling are quickly getting worse.  Your pain does not get better after you take medicine. Get help right away if:  You cannot feel your toes or foot.  Your toes or your foot looks blue.  You have very bad pain that gets worse. This information is not intended to replace advice given to you by your health care provider. Make sure you discuss any questions you have with your health care provider. Document Released: 03/31/2008 Document Revised: 03/20/2016 Document Reviewed: 05/15/2015 Elsevier Interactive Patient Education  [employer]2018 [employer]

## 2018-11-09 IMAGING — US US ABDOMEN LIMITED
1 series · 14 of 25 positions shown · non-contrast
Comparison: None in PACs

CLINICAL DATA: Abdominal pain and nausea postprandially for the
past week

EXAM:
US ABDOMEN LIMITED - RIGHT UPPER QUADRANT

[Series 1: us abdomen limited · 0.32mm/px · 14 of 47 slices shown]
[im 1/47]
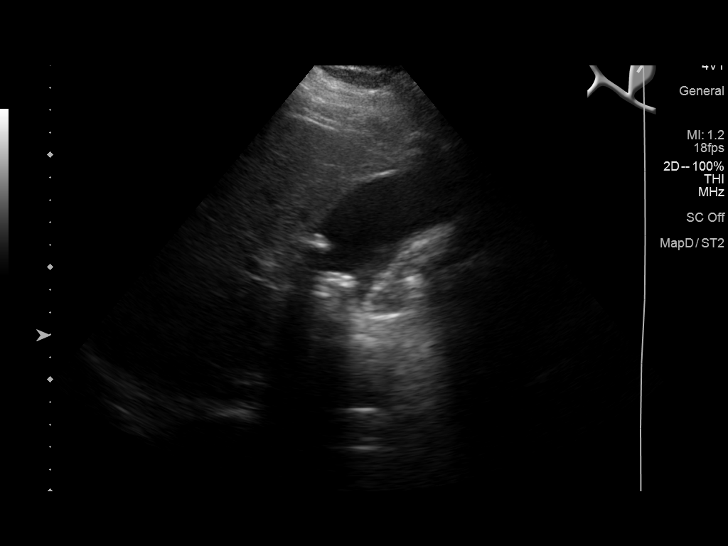
[im 4/47]
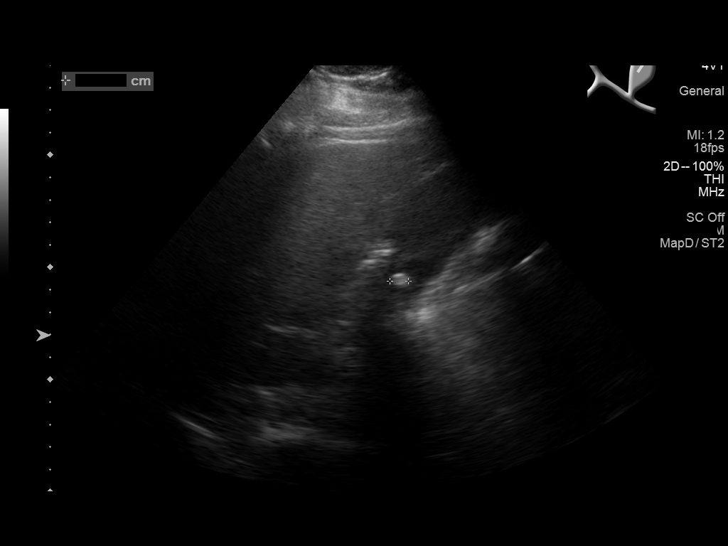
[im 8/47]
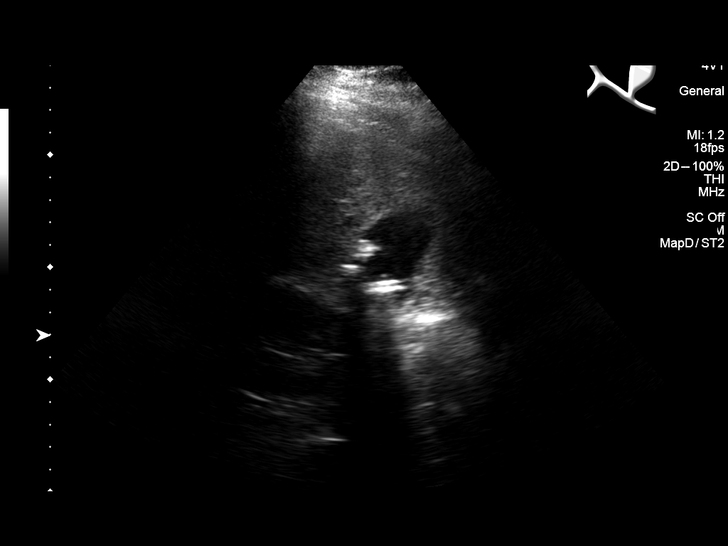
[im 12/47]
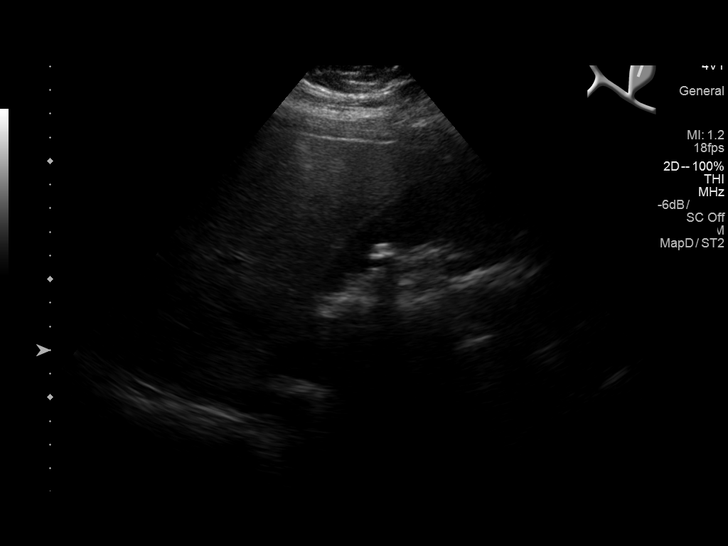
[im 16/47]
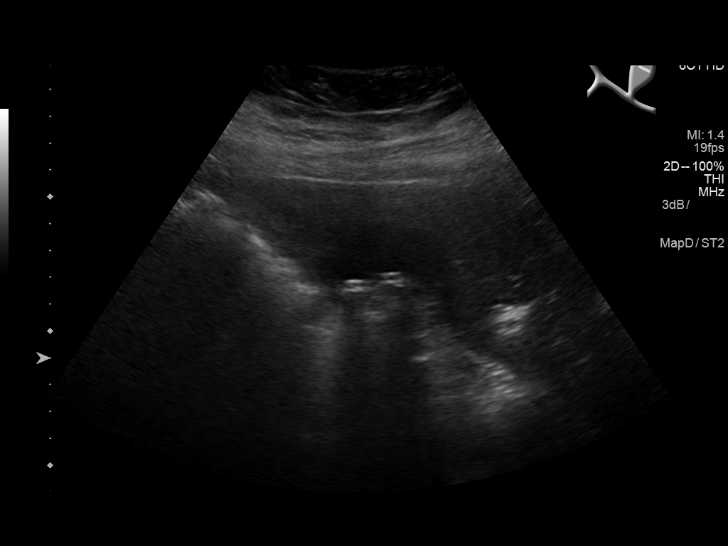
[im 18/47]
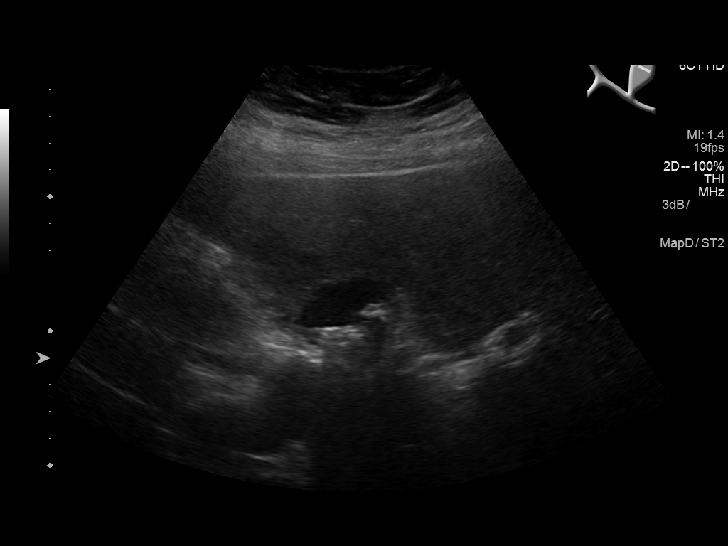
[im 22/47]
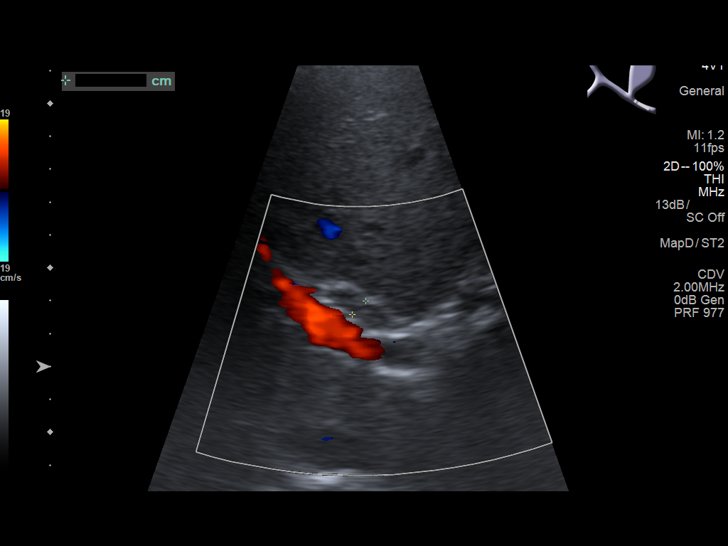
[im 25/47]
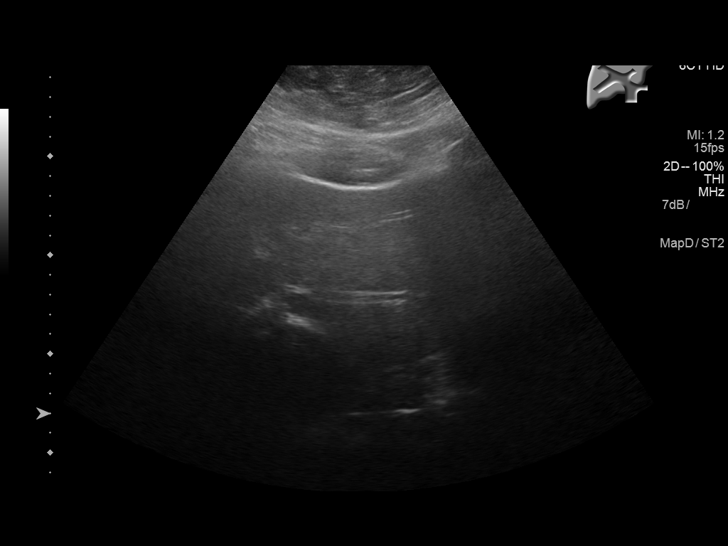
[im 29/47]
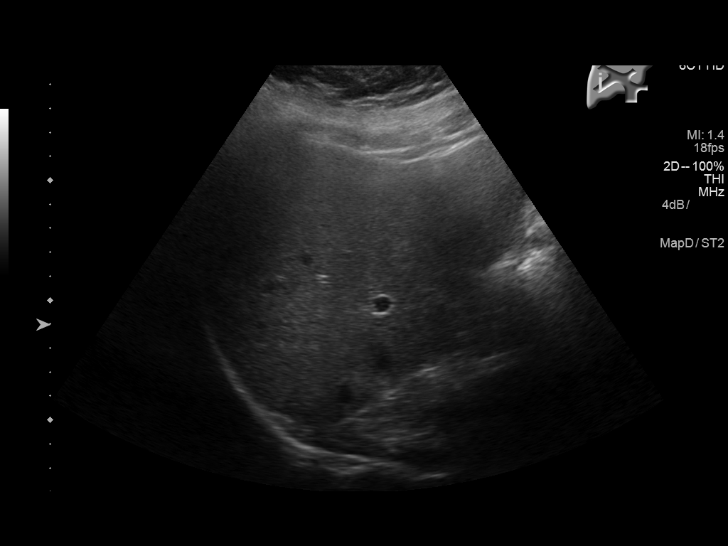
[im 31/47]
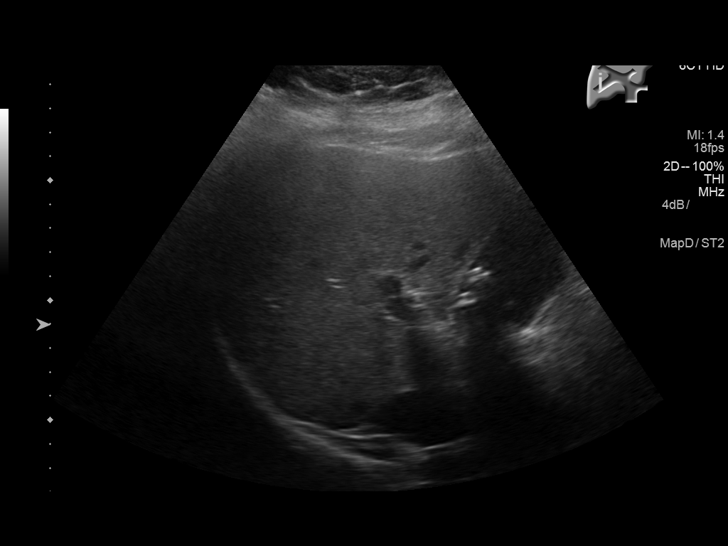
[im 35/47]
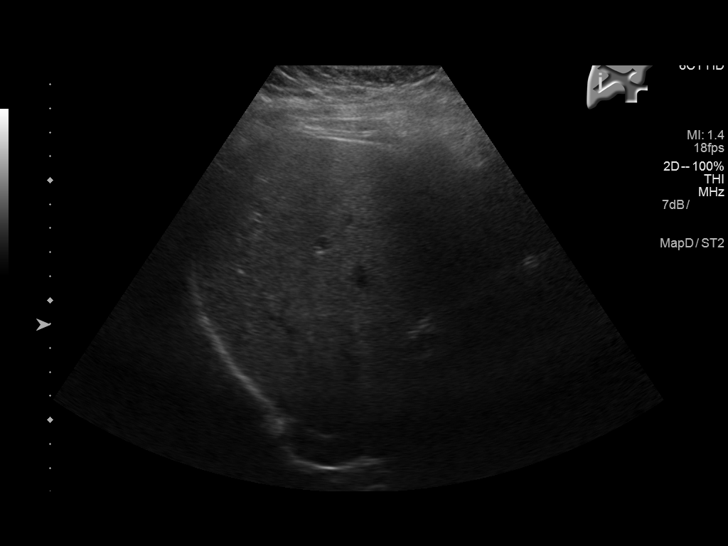
[im 39/47]
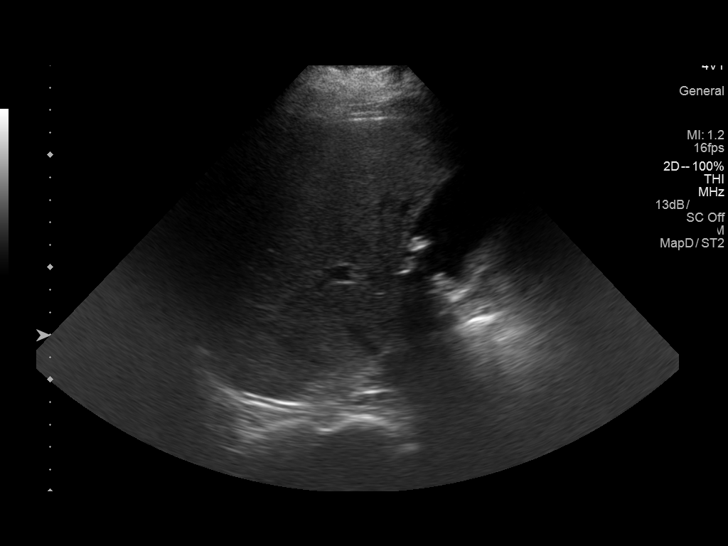
[im 43/47]
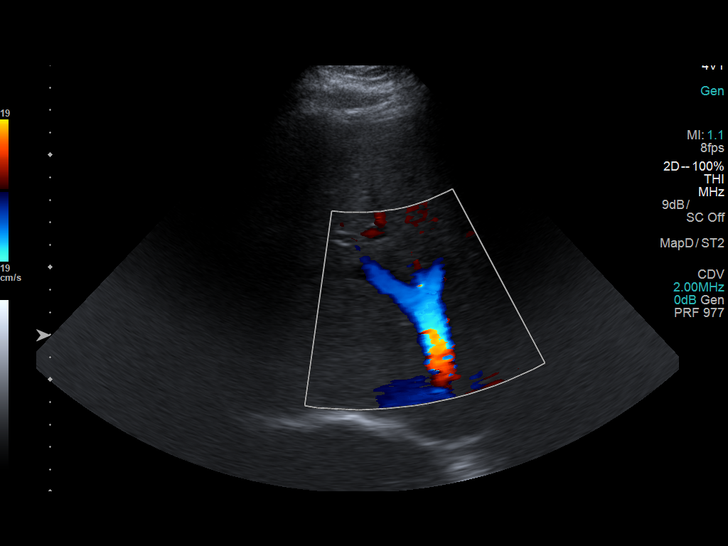
[im 47/47]
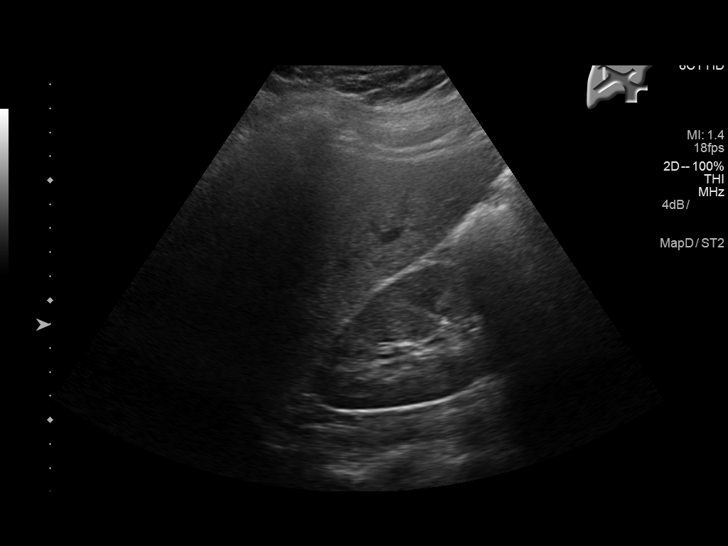

[14 of 25 positions shown; findings below may reference images not displayed]

FINDINGS: Gallbladder:

The gallbladder is adequately distended. There are multiple
echogenic mobile shadowing stones measuring up to 8 mm in diameter.
There is no gallbladder wall thickening, pericholecystic fluid, or
positive sonographic Murphy's sign.

Common bile duct:

Diameter: 5.8 mm

Liver:

The hepatic echotexture is normal. There is no focal mass nor ductal
dilation.
IMPRESSION: Gallstones without sonographic evidence of acute cholecystitis.

Normal appearance of the liver and common bile duct.

## 2018-12-27 IMAGING — CR DG CHOLANGIOGRAM OPERATIVE
2 series · 15 of 20 positions shown · non-contrast
Comparison: Ultrasound 02/02/2017

CLINICAL DATA: cholelithiasis

EXAM:
INTRAOPERATIVE CHOLANGIOGRAM
TECHNIQUE: Cholangiographic images from the C-arm fluoroscopic device were
submitted for interpretation post-operatively. Please see the
procedural report for the amount of contrast and the fluoroscopy
time utilized.

[Series 4: cont. · 7 of 35 frames shown (1 of 2)]
[frame 1/35]
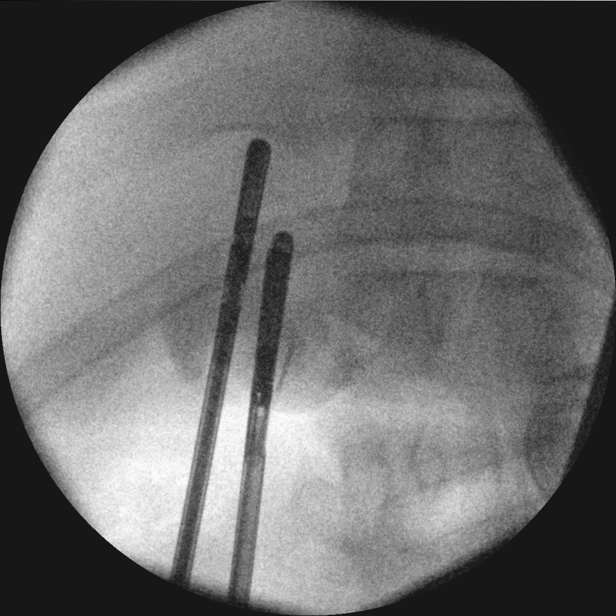
[frame 8/35]
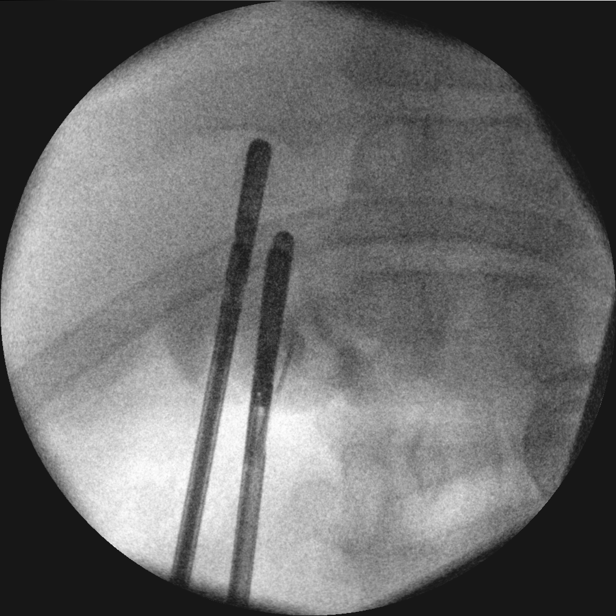
[frame 12/35]
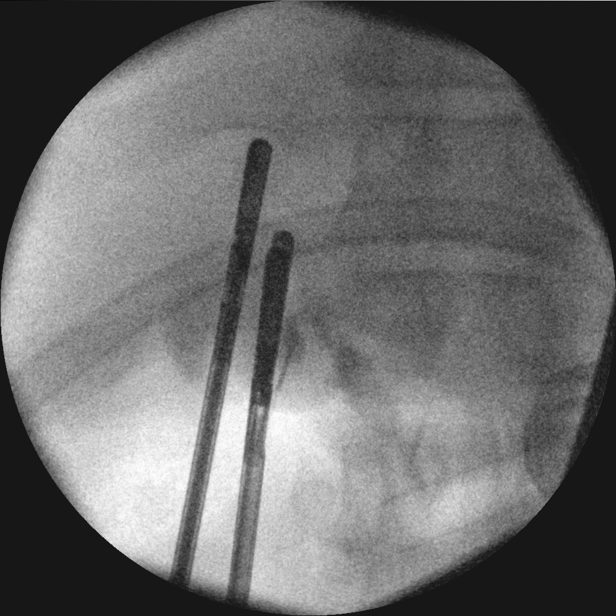
[frame 16/35]
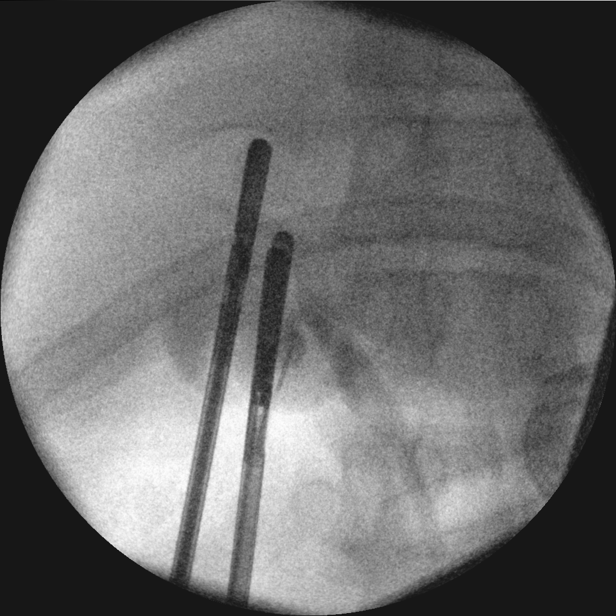
[frame 23/35]
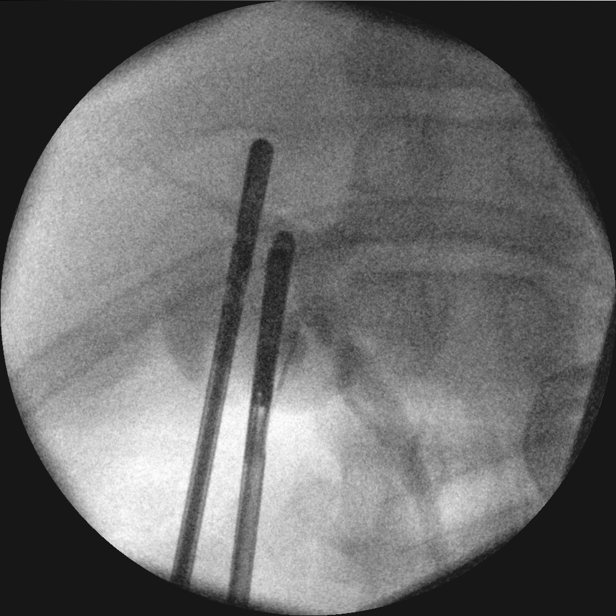
[frame 27/35]
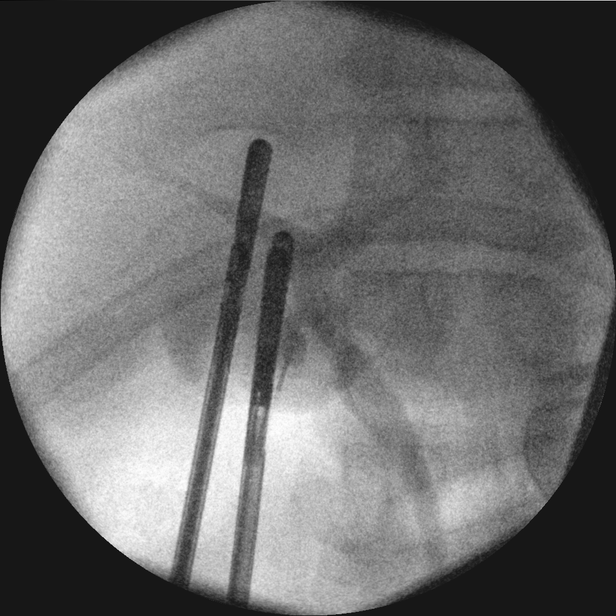
[frame 31/35]
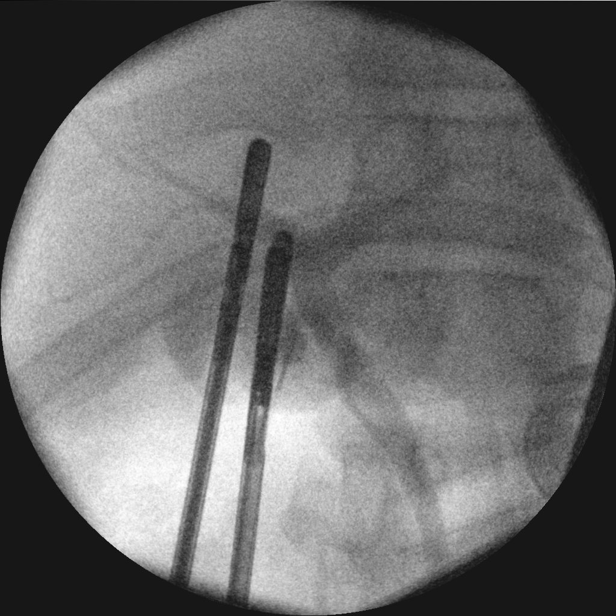

[Series 5: cont. · 8 of 18 frames shown (2 of 2)]
[frame 1/18]
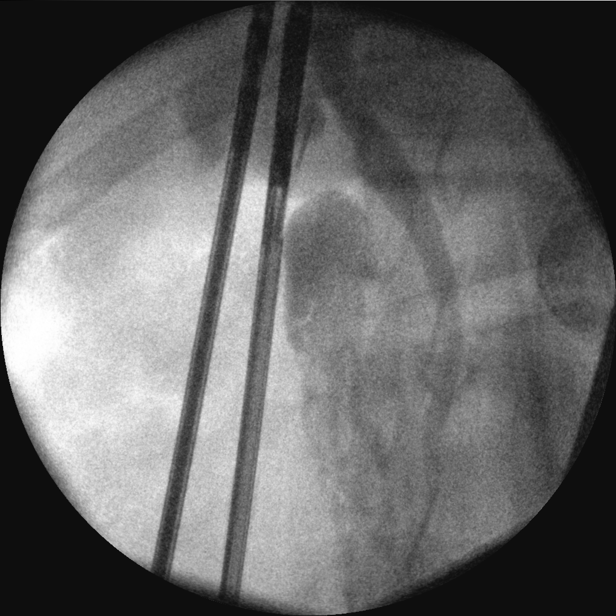
[frame 2/18]
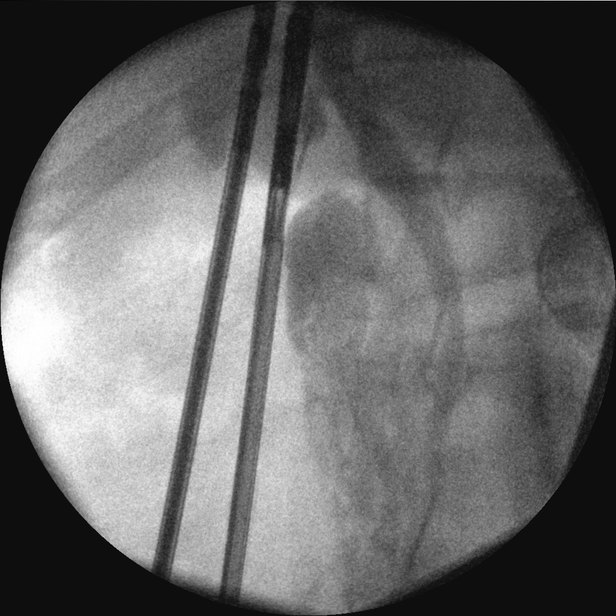
[frame 4/18]
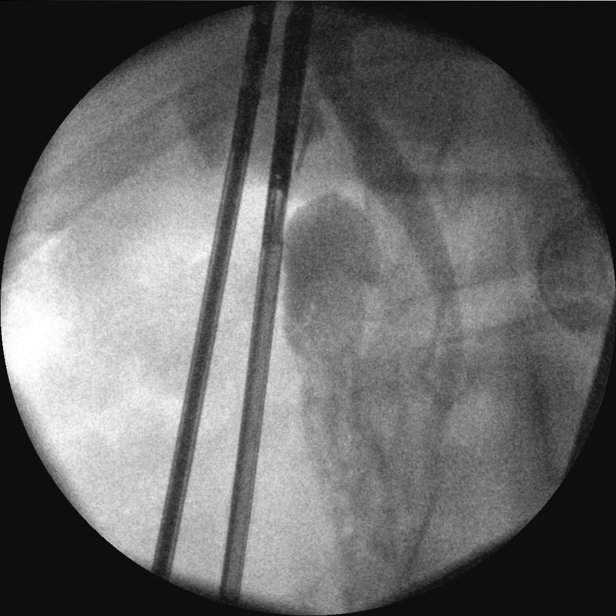
[frame 8/18]
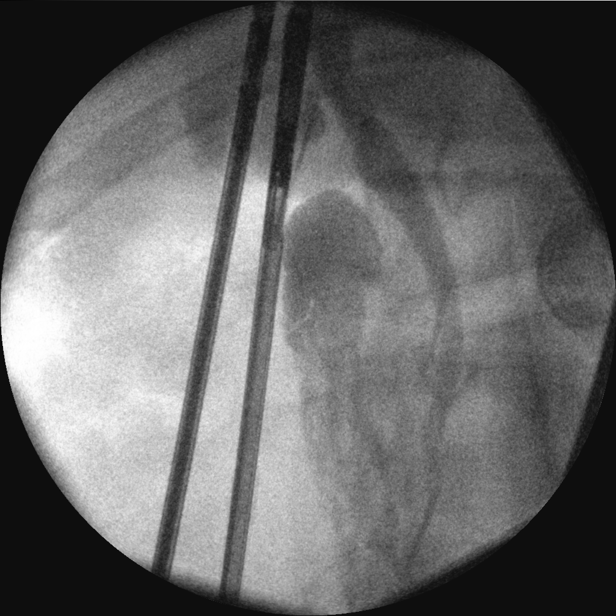
[frame 10/18]
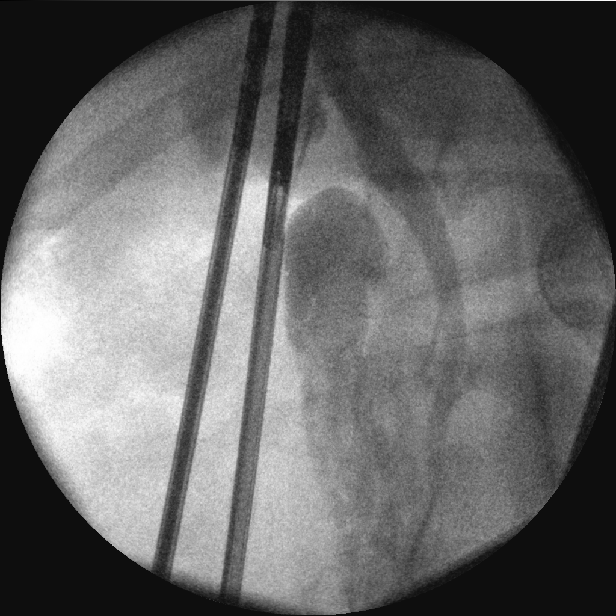
[frame 12/18]
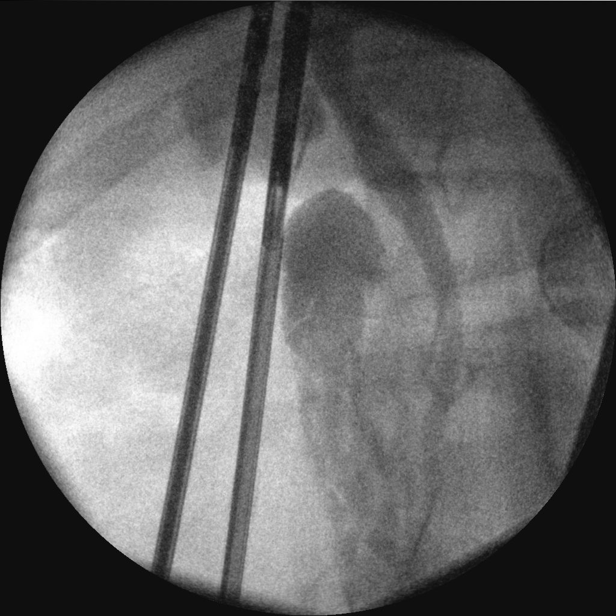
[frame 16/18]
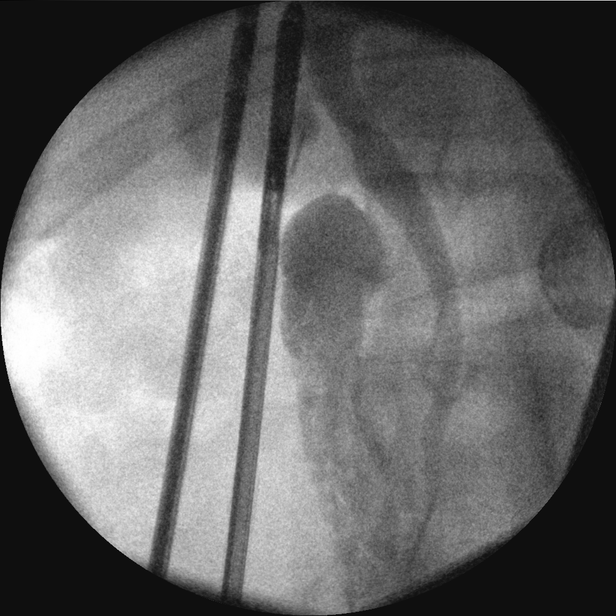
[frame 18/18]
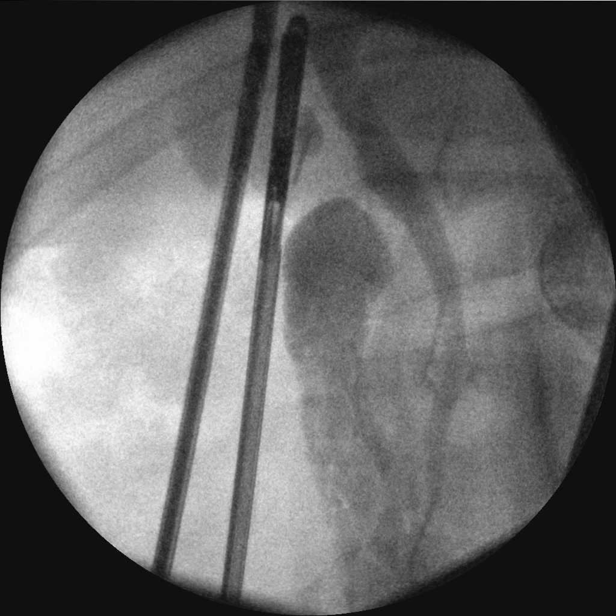

[15 of 20 positions shown; findings below may reference images not displayed]

FINDINGS: No persistent filling defects in the common duct. Intrahepatic ducts
are incompletely visualized, appearing decompressed centrally.
Contrast passes into the duodenum.

:
Negative for retained common duct stone.
